# Patient Record
Sex: Female | Born: 1965 | Race: White | Hispanic: No | Marital: Married | Smoking: Former smoker
Health system: Southern US, Community
[De-identification: ages and names within clinical notes are randomized; demographics above are authoritative.]

## PROBLEM LIST (undated history)

## (undated) DIAGNOSIS — M6780 Other specified disorders of synovium and tendon, unspecified site: Secondary | ICD-10-CM

## (undated) DIAGNOSIS — E119 Type 2 diabetes mellitus without complications: Secondary | ICD-10-CM

## (undated) DIAGNOSIS — G5600 Carpal tunnel syndrome, unspecified upper limb: Secondary | ICD-10-CM

## (undated) DIAGNOSIS — F419 Anxiety disorder, unspecified: Secondary | ICD-10-CM

## (undated) DIAGNOSIS — R519 Headache, unspecified: Secondary | ICD-10-CM

## (undated) DIAGNOSIS — D492 Neoplasm of unspecified behavior of bone, soft tissue, and skin: Secondary | ICD-10-CM

## (undated) DIAGNOSIS — M4712 Other spondylosis with myelopathy, cervical region: Secondary | ICD-10-CM

## (undated) DIAGNOSIS — E785 Hyperlipidemia, unspecified: Secondary | ICD-10-CM

## (undated) DIAGNOSIS — K219 Gastro-esophageal reflux disease without esophagitis: Secondary | ICD-10-CM

## (undated) DIAGNOSIS — I739 Peripheral vascular disease, unspecified: Secondary | ICD-10-CM

## (undated) DIAGNOSIS — H269 Unspecified cataract: Secondary | ICD-10-CM

## (undated) DIAGNOSIS — A6000 Herpesviral infection of urogenital system, unspecified: Secondary | ICD-10-CM

## (undated) DIAGNOSIS — N611 Abscess of the breast and nipple: Secondary | ICD-10-CM

## (undated) DIAGNOSIS — J189 Pneumonia, unspecified organism: Secondary | ICD-10-CM

## (undated) DIAGNOSIS — J45909 Unspecified asthma, uncomplicated: Secondary | ICD-10-CM

## (undated) DIAGNOSIS — B356 Tinea cruris: Secondary | ICD-10-CM

## (undated) DIAGNOSIS — M1990 Unspecified osteoarthritis, unspecified site: Secondary | ICD-10-CM

## (undated) DIAGNOSIS — Z973 Presence of spectacles and contact lenses: Secondary | ICD-10-CM

## (undated) DIAGNOSIS — I10 Essential (primary) hypertension: Secondary | ICD-10-CM

## (undated) DIAGNOSIS — R200 Anesthesia of skin: Secondary | ICD-10-CM

## (undated) DIAGNOSIS — K589 Irritable bowel syndrome without diarrhea: Secondary | ICD-10-CM

## (undated) HISTORY — DX: Carpal tunnel syndrome, unspecified upper limb: G56.00

## (undated) HISTORY — DX: Hyperlipidemia, unspecified: E78.5

## (undated) HISTORY — DX: Other specified disorders of synovium and tendon, unspecified site: M67.80

## (undated) HISTORY — DX: Unspecified cataract: H26.9

## (undated) HISTORY — DX: Type 2 diabetes mellitus without complications: E11.9

## (undated) HISTORY — DX: Anesthesia of skin: R20.0

## (undated) HISTORY — DX: Unspecified osteoarthritis, unspecified site: M19.90

## (undated) HISTORY — DX: Tinea cruris: B35.6

## (undated) HISTORY — PX: NOSE SURGERY: SHX723

## (undated) HISTORY — PX: EYE SURGERY: SHX253

## (undated) HISTORY — DX: Unspecified asthma, uncomplicated: J45.909

## (undated) HISTORY — PX: MOUTH SURGERY: SHX715

## (undated) HISTORY — DX: Neoplasm of unspecified behavior of bone, soft tissue, and skin: D49.2

## (undated) HISTORY — DX: Abscess of the breast and nipple: N61.1

## (undated) HISTORY — PX: BREAST LUMPECTOMY: SHX2

## (undated) HISTORY — DX: Gastro-esophageal reflux disease without esophagitis: K21.9

## (undated) HISTORY — DX: Herpesviral infection of urogenital system, unspecified: A60.00

## (undated) HISTORY — DX: Irritable bowel syndrome, unspecified: K58.9

## (undated) HISTORY — PX: KNEE SURGERY: SHX244

## (undated) HISTORY — DX: Anxiety disorder, unspecified: F41.9

---

## 2004-04-06 HISTORY — PX: TONSILLECTOMY: SUR1361

## 2006-05-21 ENCOUNTER — Ambulatory Visit: Payer: Self-pay | Admitting: Internal Medicine

## 2006-06-07 ENCOUNTER — Encounter: Admission: RE | Admit: 2006-06-07 | Discharge: 2006-06-07 | Payer: Self-pay | Admitting: Unknown Physician Specialty

## 2006-07-19 ENCOUNTER — Ambulatory Visit (HOSPITAL_COMMUNITY): Admission: RE | Admit: 2006-07-19 | Discharge: 2006-07-19 | Payer: Self-pay | Admitting: Internal Medicine

## 2006-07-19 ENCOUNTER — Encounter (INDEPENDENT_AMBULATORY_CARE_PROVIDER_SITE_OTHER): Payer: Self-pay | Admitting: Specialist

## 2006-07-19 ENCOUNTER — Ambulatory Visit: Payer: Self-pay | Admitting: Internal Medicine

## 2008-06-20 ENCOUNTER — Ambulatory Visit (HOSPITAL_COMMUNITY): Admission: RE | Admit: 2008-06-20 | Discharge: 2008-06-20 | Payer: Self-pay | Admitting: Ophthalmology

## 2010-04-27 ENCOUNTER — Encounter: Payer: Self-pay | Admitting: Unknown Physician Specialty

## 2010-07-17 LAB — BASIC METABOLIC PANEL
CO2: 17 mEq/L — ABNORMAL LOW (ref 19–32)
Creatinine, Ser: 0.72 mg/dL (ref 0.4–1.2)
GFR calc Af Amer: >60 mL/min (ref >60)
GFR calc non Af Amer: >60 mL/min (ref >60)
Glucose, Bld: 184 mg/dL — ABNORMAL HIGH (ref 70–99)

## 2010-07-17 LAB — GLUCOSE, CAPILLARY: Glucose-Capillary: 170 mg/dL — ABNORMAL HIGH (ref 70–99)
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
## 2010-08-22 NOTE — Op Note (Signed)
NAME:  ASHARIA, LOTTER                ACCOUNT NO.:  [id number]   MEDICAL RECORD NO.:  [id number]          PATIENT TYPE:  AMB   LOCATION:  DAY                           FACILITY:  [facility]   PHYSICIAN:  Lionel December, M.D.    DATE OF BIRTH:  02/04/1966   DATE OF PROCEDURE:  07/19/2006  DATE OF DISCHARGE:                               OPERATIVE REPORT   PROCEDURE:  Esophagogastroduodenoscopy followed by colonoscopy with  terminal ileoscopy.   INDICATIONS:  Haley Dunn is a 45 year old Caucasian female with a several-year  history of GERD, who is undergoing EGD to make sure she does not have  Barrett's.  She has a history of bloody diarrhea that started over 2  months ago.  Stool studies have been negative.  She is been treated with  Cipro and Flagyl, without symptomatic improvement.  She is therefore  undergoing diagnostic colonoscopy.  One of her aunts has Crohn's  disease.   Procedure and risks were reviewed with the patient, and informed consent  was obtained.   MEDICATIONS FOR CONSCIOUS SEDATION:  1. Benzocaine spray for pharyngeal topical anesthesia.  2. Demerol 50 mg IV.  3. Versed 12 mg IV.   FINDINGS:  Procedure performed in endoscopy suite.  The patient's vital  signs and O2 saturation were monitored during the procedure and remained  stable.   1. Esophagogastroduodenoscopy.  The patient was placed into the left      lateral position, and Pentax videoscope was passed via oropharynx      without any difficulty into esophagus.   Esophagus:  Mucosa of the esophagus normal.  GE junction was located at  36 8 cm from the incisors, and hiatus was at 40 cm.   Stomach:  It was empty and distended very well insufflation.  Folds of  the proximal stomach were normal.  Examination of the mucosa revealed  patchy erythema and few erosions at antrum.  Pyloric channel was patent.  Angularis, fundus, and cardia were examined by retroflexing the scope  and were normal.   Duodenum:  Bulbar mucosa  revealed patchy erythema, but no erosions or  ulcers were noted.  The scope was passed to the second part of the  duodenum, where mucosa and folds were normal.  Endoscope was withdrawn.  The patient was prepared for procedure #2.   1. Colonoscopy.  Rectal examination was performed.  No abnormality      noted on external or digital exam.  Pentax videoscope was placed      into the rectum and advanced under vision into sigmoid colon and      beyond.  Preparation was satisfactory.  Scope was passed to the      cecum, which was identified by appendiceal orifice and ileocecal      valve.  Short segment of TI was also examined and was normal.      Colonic mucosa was carefully examined on the way out and revealed      normal mucosal pattern throughout.  Random biopsies were taken from      the sigmoid colon.  Rectal mucosa similarly was  normal.  The scope      was retroflexed to examine the anorectal junction.  There was 4 4      mm polyp proximal to the dentate line which was ablated via cold      biopsy.  Small hemorrhoids were noted below the dentate line.  The      endoscope was straightened and withdrawn.  The patient tolerated      the procedures well.   FINAL DIAGNOSES:  1. A small sliding hiatal hernia.  No evidence of Barrett's esophagus.  2. Erosive antral gastritis, with bulbar duodenitis.  This is possibly      related to nonsteroidal antiinflammatory drug use, but we will      check Helicobacter pylori to rule out Helicobacter pylori      infection.  3. Normal colonoscopy and terminal ileoscopy, except a 4 mm polyp in      the rectum which was ablated via cold biopsy.  4. Small external hemorrhoids.  5. No evidence of Crohn's disease or ulcerative colitis.   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   RECOMMENDATIONS:  1. She will continue antireflux measures and Nexium as before.  2. Levbid 1 tablet  every morning.  3. Benefiber 4 g daily.   We will check her CBC with differential and Helicobacter pylori serology  today.      Lionel December, M.D.  Electronically Signed     NR/MEDQ  D:  07/19/2006  T:  07/19/2006  Job:  [id number]   cc:   Doreen Beam  Fax: [phone]

## 2014-08-01 ENCOUNTER — Encounter: Payer: Self-pay | Admitting: Vascular Surgery

## 2014-08-01 ENCOUNTER — Other Ambulatory Visit: Payer: Self-pay | Admitting: *Deleted

## 2014-08-01 DIAGNOSIS — M79604 Pain in right leg: Secondary | ICD-10-CM

## 2014-08-29 ENCOUNTER — Encounter: Payer: Self-pay | Admitting: Vascular Surgery

## 2014-08-30 ENCOUNTER — Ambulatory Visit (INDEPENDENT_AMBULATORY_CARE_PROVIDER_SITE_OTHER): Payer: BLUE CROSS/BLUE SHIELD | Admitting: Vascular Surgery

## 2014-08-30 ENCOUNTER — Encounter: Payer: Self-pay | Admitting: Vascular Surgery

## 2014-08-30 ENCOUNTER — Ambulatory Visit (HOSPITAL_COMMUNITY)
Admission: RE | Admit: 2014-08-30 | Discharge: 2014-08-30 | Disposition: A | Discharge status: Home / Self Care | Payer: BLUE CROSS/BLUE SHIELD | Source: Ambulatory Visit | Attending: Vascular Surgery | Admitting: Vascular Surgery

## 2014-08-30 ENCOUNTER — Other Ambulatory Visit: Payer: Self-pay

## 2014-08-30 VITALS — BP 161/76 | HR 91 | Ht 64.0 in | Wt 199.3 lb

## 2014-08-30 DIAGNOSIS — F17210 Nicotine dependence, cigarettes, uncomplicated: Secondary | ICD-10-CM

## 2014-08-30 DIAGNOSIS — I739 Peripheral vascular disease, unspecified: Secondary | ICD-10-CM

## 2014-08-30 DIAGNOSIS — M79604 Pain in right leg: Secondary | ICD-10-CM | POA: Insufficient documentation

## 2014-08-30 NOTE — Progress Notes (Signed)
[facility] [place] HISTORY AND PHYSICAL   History of Present Illness:  Patient is a 49 y.o. year old female who presents for evaluation of right leg pain.  The patient has had pain in her right leg since December. She fell at that time. She had chronic right knee pain prior to that. However after her fall she began having hip pain on the right side. This has become progressively worse over the last several months. She has now progressed to the point where she has numbness in her right foot. She also has pain in her right foot which worsens at night time. She is unable to walk due to pain on short distance. She currently smokes a half pack of cigarettes per day. She smoked 1 pack per day prior to this. She is trying to quit. Greater than 3 minutes they were spent regarding smoking cessation counseling.  Other medical problems include diabetes, anxiety, asthma all of which are currently controlled.  Past Medical History  Diagnosis Date  . Acid reflux   . Anxiety   . Asthma   . Cataracts, bilateral   . Diabetes mellitus without complication     Past Surgical History  Procedure Laterality Date  . Nose surgery    . Mouth surgery    . Knee surgery    . Breast lumpectomy    . Tonsillectomy  2006    Social History History  Substance Use Topics  . Smoking status: Current Every Day Smoker -- 0.50 packs/day for 30 years    Types: Cigarettes  . Smokeless tobacco: Not on file  . Alcohol Use: 2.4 - 3.6 oz/week    4-6 Cans of beer per week    Family History Family History  Problem Relation Age of Onset  . Cancer Mother   . Deep vein thrombosis Father    she has multiple relatives that a been taken care of by our practice with advanced atherosclerosis  Allergies  Allergies  Allergen Reactions  . Codeine Itching  . Sulfur Rash     Current Outpatient Prescriptions  Medication Sig Dispense Refill  . ALPRAZolam (XANAX) 1 MG tablet Take 0.5 tablets by mouth 2  (two) times daily as needed.  3  . diclofenac (VOLTAREN) 75 MG EC tablet Take 75 mg by mouth 2 (two) times daily.  5  . furosemide (LASIX) 40 MG tablet Take 1 tablet by mouth as needed.  1  . glipiZIDE (GLUCOTROL) 10 MG tablet Take 10 mg by mouth 2 (two) times daily.  1  . metFORMIN (GLUCOPHAGE) 500 MG tablet Take 1 tablet by mouth 2 (two) times daily.  5  . omeprazole (PRILOSEC) 20 MG capsule Take 20 mg by mouth daily.  12  . OSENI 25-15 MG TABS Take 1 tablet by mouth daily.  0   No current facility-administered medications for this visit.    ROS:   General:  No weight loss, Fever, chills  HEENT: No recent headaches, no nasal bleeding, no visual changes, no sore throat  Neurologic: No dizziness, blackouts, seizures. No recent symptoms of stroke or mini- stroke. No recent episodes of slurred speech, or temporary blindness.  Cardiac: No recent episodes of chest pain/pressure, no shortness of breath at rest.  No shortness of breath with exertion.  Denies history of atrial fibrillation or irregular heartbeat  Vascular: + history of rest pain in feet. +history of claudication.  No history of non-healing ulcer, No history of DVT   Pulmonary: No home  oxygen, no productive cough, no hemoptysis,  + asthma or wheezing  Musculoskeletal:  [x ] Arthritis, [x ] Low back pain,  [x ] Joint pain  Hematologic:No history of hypercoagulable state.  No history of easy bleeding.  No history of anemia  Gastrointestinal: No hematochezia or melena,  + gastroesophageal reflux, no trouble swallowing  Urinary: [ ]  chronic Kidney disease, [ ]  on HD - [ ]  MWF or [ ]  TTHS, [ ]  Burning with urination, [ ]  Frequent urination, [ ]  Difficulty urinating;   Skin: No rashes  Psychological: + history of anxiety,  No history of depression   Physical Examination  Filed Vitals:   08/30/14 1429  BP: 161/76  Pulse: 91  Height: 5\' 4"  (1.626 m)  Weight: 199 lb 4.8 oz (90.402 kg)  SpO2: 98%    Body mass index is  34.19 kg/(m^2).  General:  Alert and oriented, no acute distress HEENT: Normal Neck: No bruit or JVD Pulmonary: Clear to auscultation bilaterally Cardiac: Regular Rate and Rhythm without murmur Abdomen: Soft, non-tender, non-distended, no mass, obese Skin: No rash, no ulcer Extremity Pulses:  2+ radial, brachial, negative femoral bilaterally, 2+ left dorsalis pedis, absent popliteal and right dorsalis pedis pulse absent posterior tibial pulses bilaterally Musculoskeletal: No deformity or edema  Neurologic: Upper and lower extremity motor 5/5 and symmetric  DATA: Patient had bilateral ABIs performed on 07/24/2014. 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 She had biphasic flow on the left side also suggesting iliac occlusive disease.  ASSESSMENT:  Right lower extremity claudication now with rest pain.   PLAN:  #1 patient was started on one aspirin daily today.               #2 consideration needs to begin starting the patient on a statin since presence of peripheral arterial disease patients have had improved mortality on a statin.  I will leave this at the discretion of her primary care physician.                #3 patient will be scheduled for aortogram bilateral lower extremity runoff possible intervention on 09/07/2014. Risks benefits possible complications and procedure details were explained to the patient today including but not limited to bleeding infection vessel injury. I also discussed with the patient today that the occlusive disease is extensive she may require an operation rather than a peripheral intervention and this will be scheduled for a later date after further evaluation by cardiology.  Ruta Hinds, MD [facility] [place] Office: [phone] Pager: [phone]

## 2014-09-04 ENCOUNTER — Encounter (HOSPITAL_COMMUNITY): Payer: Self-pay

## 2014-09-04 ENCOUNTER — Encounter: Payer: Self-pay | Admitting: Vascular Surgery

## 2014-09-07 ENCOUNTER — Encounter (HOSPITAL_COMMUNITY): Payer: Self-pay | Admitting: Vascular Surgery

## 2014-09-07 ENCOUNTER — Ambulatory Visit (HOSPITAL_COMMUNITY)
Admission: RE | Admit: 2014-09-07 | Discharge: 2014-09-07 | Disposition: A | Discharge status: Home / Self Care | Payer: BLUE CROSS/BLUE SHIELD | Source: Ambulatory Visit | Attending: Vascular Surgery | Admitting: Vascular Surgery

## 2014-09-07 ENCOUNTER — Telehealth: Payer: Self-pay | Admitting: Vascular Surgery

## 2014-09-07 ENCOUNTER — Encounter (HOSPITAL_COMMUNITY)
Admission: RE | Disposition: A | Discharge status: Home / Self Care | Payer: BLUE CROSS/BLUE SHIELD | Source: Ambulatory Visit | Attending: Vascular Surgery

## 2014-09-07 ENCOUNTER — Other Ambulatory Visit: Payer: Self-pay

## 2014-09-07 DIAGNOSIS — I7092 Chronic total occlusion of artery of the extremities: Secondary | ICD-10-CM | POA: Diagnosis not present

## 2014-09-07 DIAGNOSIS — E119 Type 2 diabetes mellitus without complications: Secondary | ICD-10-CM | POA: Diagnosis not present

## 2014-09-07 DIAGNOSIS — I70211 Atherosclerosis of native arteries of extremities with intermittent claudication, right leg: Secondary | ICD-10-CM | POA: Diagnosis not present

## 2014-09-07 DIAGNOSIS — F419 Anxiety disorder, unspecified: Secondary | ICD-10-CM | POA: Insufficient documentation

## 2014-09-07 DIAGNOSIS — J45909 Unspecified asthma, uncomplicated: Secondary | ICD-10-CM | POA: Diagnosis not present

## 2014-09-07 DIAGNOSIS — F17210 Nicotine dependence, cigarettes, uncomplicated: Secondary | ICD-10-CM | POA: Insufficient documentation

## 2014-09-07 DIAGNOSIS — I70203 Unspecified atherosclerosis of native arteries of extremities, bilateral legs: Secondary | ICD-10-CM | POA: Diagnosis not present

## 2014-09-07 DIAGNOSIS — G8929 Other chronic pain: Secondary | ICD-10-CM | POA: Insufficient documentation

## 2014-09-07 DIAGNOSIS — I70201 Unspecified atherosclerosis of native arteries of extremities, right leg: Secondary | ICD-10-CM | POA: Diagnosis present

## 2014-09-07 HISTORY — PX: PERIPHERAL VASCULAR CATHETERIZATION: SHX172C

## 2014-09-07 LAB — POCT I-STAT, CHEM 8
BUN: 20 mg/dL (ref 6–20)
CALCIUM ION: 1.19 mmol/L (ref 1.12–1.23)
Chloride: 105 mmol/L (ref 101–111)
Creatinine, Ser: 0.7 mg/dL (ref 0.44–1.00)
Glucose, Bld: 164 mg/dL — ABNORMAL HIGH (ref 65–99)
HCT: 44 % (ref 36.0–46.0)
Hemoglobin: 15 g/dL (ref 12.0–15.0)
POTASSIUM: 3.7 mmol/L (ref 3.5–5.1)
Sodium: 138 mmol/L (ref 135–145)
TCO2: 22 mmol/L (ref 0–100)

## 2014-09-07 LAB — GLUCOSE, CAPILLARY: Glucose-Capillary: 146 mg/dL — ABNORMAL HIGH (ref 65–99)

## 2014-09-07 LAB — PREGNANCY, URINE: Preg Test, Ur: NEGATIVE

## 2014-09-07 SURGERY — ABDOMINAL AORTOGRAM
Anesthesia: LOCAL

## 2014-09-07 MED ORDER — MORPHINE SULFATE 10 MG/ML IJ SOLN: 2.0000 mg | INTRAMUSCULAR | Status: DC | PRN

## 2014-09-07 MED ORDER — POTASSIUM CHLORIDE CRYS ER 20 MEQ PO TBCR: 20.0000 meq | EXTENDED_RELEASE_TABLET | Freq: Every day | ORAL | Status: DC | PRN

## 2014-09-07 MED ORDER — SODIUM CHLORIDE 0.9 % IV SOLN
INTRAVENOUS | Status: DC
  Administered 2014-09-07: 07:00:00 via INTRAVENOUS

## 2014-09-07 MED ORDER — PHENOL 1.4 % MT LIQD: 1.0000 | OROMUCOSAL | Status: DC | PRN

## 2014-09-07 MED ORDER — FENTANYL CITRATE (PF) 100 MCG/2ML IJ SOLN
INTRAMUSCULAR | Status: AC
  Filled 2014-09-07: qty 2

## 2014-09-07 MED ORDER — ACETAMINOPHEN 325 MG PO TABS: 325.0000 mg | ORAL_TABLET | ORAL | Status: DC | PRN

## 2014-09-07 MED ORDER — SODIUM CHLORIDE 0.45 % IV SOLN
INTRAVENOUS | Status: DC
Start: 2014-09-07 — End: 2014-09-07

## 2014-09-07 MED ORDER — LIDOCAINE HCL (PF) 1 % IJ SOLN
INTRAMUSCULAR | Status: DC | PRN
  Administered 2014-09-07: 30 mL

## 2014-09-07 MED ORDER — SODIUM CHLORIDE 0.9 % IV SOLN: 500.0000 mL | Freq: Once | INTRAVENOUS | Status: DC | PRN

## 2014-09-07 MED ORDER — LABETALOL HCL 5 MG/ML IV SOLN: 10.0000 mg | INTRAVENOUS | Status: DC | PRN

## 2014-09-07 MED ORDER — ACETAMINOPHEN 325 MG RE SUPP: 325.0000 mg | RECTAL | Status: DC | PRN

## 2014-09-07 MED ORDER — IODIXANOL 320 MG/ML IV SOLN
INTRAVENOUS | Status: DC | PRN
  Administered 2014-09-07: 130 mL via INTRAVENOUS

## 2014-09-07 MED ORDER — MIDAZOLAM HCL 2 MG/2ML IJ SOLN
INTRAMUSCULAR | Status: AC
  Filled 2014-09-07: qty 2

## 2014-09-07 MED ORDER — METOPROLOL TARTRATE 1 MG/ML IV SOLN: 2.0000 mg | INTRAVENOUS | Status: DC | PRN

## 2014-09-07 MED ORDER — LIDOCAINE HCL (PF) 1 % IJ SOLN
INTRAMUSCULAR | Status: AC
  Filled 2014-09-07: qty 30

## 2014-09-07 MED ORDER — GUAIFENESIN-DM 100-10 MG/5ML PO SYRP: 15.0000 mL | ORAL_SOLUTION | ORAL | Status: DC | PRN

## 2014-09-07 MED ORDER — ONDANSETRON HCL 4 MG/2ML IJ SOLN: 4.0000 mg | Freq: Four times a day (QID) | INTRAMUSCULAR | Status: DC | PRN

## 2014-09-07 MED ORDER — MAGNESIUM SULFATE 2 GM/50ML IV SOLN: 2.0000 g | Freq: Every day | INTRAVENOUS | Status: DC | PRN

## 2014-09-07 MED ORDER — HYDRALAZINE HCL 20 MG/ML IJ SOLN: 5.0000 mg | INTRAMUSCULAR | Status: DC | PRN

## 2014-09-07 MED ORDER — DOCUSATE SODIUM 100 MG PO CAPS: 100.0000 mg | ORAL_CAPSULE | Freq: Every day | ORAL | Status: DC

## 2014-09-07 MED ORDER — HEPARIN (PORCINE) IN NACL 2-0.9 UNIT/ML-% IJ SOLN
INTRAMUSCULAR | Status: AC
  Filled 2014-09-07: qty 1000

## 2014-09-07 MED ORDER — MIDAZOLAM HCL 2 MG/2ML IJ SOLN
INTRAMUSCULAR | Status: DC | PRN
  Administered 2014-09-07: 1 mg via INTRAVENOUS

## 2014-09-07 MED ORDER — DEXTROSE 5 % IV SOLN: 1.5000 g | Freq: Two times a day (BID) | INTRAVENOUS | Status: DC

## 2014-09-07 SURGICAL SUPPLY — 13 items
CATH ANGIO 5F PIGTAIL 65CM (CATHETERS) ×1 IMPLANT
COVER PRB 48X5XTLSCP FOLD TPE (BAG) IMPLANT
COVER PROBE 5X48 (BAG) ×3
HAND CONTROLLER AVANTA (MISCELLANEOUS) IMPLANT
KIT PV (KITS) ×3 IMPLANT
SET AVANTA MULTI PATIENT (MISCELLANEOUS) IMPLANT
SET AVANTA SINGLE PATIENT (MISCELLANEOUS) IMPLANT
SHEATH AVANTA HAND CONTROLLER (MISCELLANEOUS) IMPLANT
SHEATH PINNACLE 5F 10CM (SHEATH) ×1 IMPLANT
SYR MEDRAD MARK V 150ML (SYRINGE) ×1 IMPLANT
TRANSDUCER W/STOPCOCK (MISCELLANEOUS) ×3 IMPLANT
TRAY PV CATH (CUSTOM PROCEDURE TRAY) ×3 IMPLANT
WIRE HITORQ VERSACORE ST 145CM (WIRE) ×1 IMPLANT

## 2014-09-07 NOTE — Progress Notes (Signed)
Site area: Left groin a 5 french arterial sheath was removed  Site Prior to Removal:  Level 0  Pressure Applied For 20 MINUTES    Minutes Beginning at 1005a  Manual:   Yes.    Patient Status During Pull:  stable  Post Pull Groin Site:  Level 0  Post Pull Instructions Given:  Yes.    Post Pull Pulses Present:  Yes.    Dressing Applied:  Yes.    Comments:  VS remain stable during sheath pull. Pt denies any discomfort at site at this time.

## 2014-09-07 NOTE — H&P (View-Only) (Signed)
[facility] [place] HISTORY AND PHYSICAL   History of Present Illness:  Patient is a 49 y.o. year old female who presents for evaluation of right leg pain.  The patient has had pain in her right leg since December. She fell at that time. She had chronic right knee pain prior to that. However after her fall she began having hip pain on the right side. This has become progressively worse over the last several months. She has now progressed to the point where she has numbness in her right foot. She also has pain in her right foot which worsens at night time. She is unable to walk due to pain on short distance. She currently smokes a half pack of cigarettes per day. She smoked 1 pack per day prior to this. She is trying to quit. Greater than 3 minutes they were spent regarding smoking cessation counseling.  Other medical problems include diabetes, anxiety, asthma all of which are currently controlled.  Past Medical History  Diagnosis Date  . Acid reflux   . Anxiety   . Asthma   . Cataracts, bilateral   . Diabetes mellitus without complication     Past Surgical History  Procedure Laterality Date  . Nose surgery    . Mouth surgery    . Knee surgery    . Breast lumpectomy    . Tonsillectomy  2006    Social History History  Substance Use Topics  . Smoking status: Current Every Day Smoker -- 0.50 packs/day for 30 years    Types: Cigarettes  . Smokeless tobacco: Not on file  . Alcohol Use: 2.4 - 3.6 oz/week    4-6 Cans of beer per week    Family History Family History  Problem Relation Age of Onset  . Cancer Mother   . Deep vein thrombosis Father    she has multiple relatives that a been taken care of by our practice with advanced atherosclerosis  Allergies  Allergies  Allergen Reactions  . Codeine Itching  . Sulfur Rash     Current Outpatient Prescriptions  Medication Sig Dispense Refill  . ALPRAZolam (XANAX) 1 MG tablet Take 0.5 tablets by mouth 2  (two) times daily as needed.  3  . diclofenac (VOLTAREN) 75 MG EC tablet Take 75 mg by mouth 2 (two) times daily.  5  . furosemide (LASIX) 40 MG tablet Take 1 tablet by mouth as needed.  1  . glipiZIDE (GLUCOTROL) 10 MG tablet Take 10 mg by mouth 2 (two) times daily.  1  . metFORMIN (GLUCOPHAGE) 500 MG tablet Take 1 tablet by mouth 2 (two) times daily.  5  . omeprazole (PRILOSEC) 20 MG capsule Take 20 mg by mouth daily.  12  . OSENI 25-15 MG TABS Take 1 tablet by mouth daily.  0   No current facility-administered medications for this visit.    ROS:   General:  No weight loss, Fever, chills  HEENT: No recent headaches, no nasal bleeding, no visual changes, no sore throat  Neurologic: No dizziness, blackouts, seizures. No recent symptoms of stroke or mini- stroke. No recent episodes of slurred speech, or temporary blindness.  Cardiac: No recent episodes of chest pain/pressure, no shortness of breath at rest.  No shortness of breath with exertion.  Denies history of atrial fibrillation or irregular heartbeat  Vascular: + history of rest pain in feet. +history of claudication.  No history of non-healing ulcer, No history of DVT   Pulmonary: No home  oxygen, no productive cough, no hemoptysis,  + asthma or wheezing  Musculoskeletal:  [x ] Arthritis, [x ] Low back pain,  [x ] Joint pain  Hematologic:No history of hypercoagulable state.  No history of easy bleeding.  No history of anemia  Gastrointestinal: No hematochezia or melena,  + gastroesophageal reflux, no trouble swallowing  Urinary: [ ]  chronic Kidney disease, [ ]  on HD - [ ]  MWF or [ ]  TTHS, [ ]  Burning with urination, [ ]  Frequent urination, [ ]  Difficulty urinating;   Skin: No rashes  Psychological: + history of anxiety,  No history of depression   Physical Examination  Filed Vitals:   08/30/14 1429  BP: 161/76  Pulse: 91  Height: 5\' 4"  (1.626 m)  Weight: 199 lb 4.8 oz (90.402 kg)  SpO2: 98%    Body mass index is  34.19 kg/(m^2).  General:  Alert and oriented, no acute distress HEENT: Normal Neck: No bruit or JVD Pulmonary: Clear to auscultation bilaterally Cardiac: Regular Rate and Rhythm without murmur Abdomen: Soft, non-tender, non-distended, no mass, obese Skin: No rash, no ulcer Extremity Pulses:  2+ radial, brachial, negative femoral bilaterally, 2+ left dorsalis pedis, absent popliteal and right dorsalis pedis pulse absent posterior tibial pulses bilaterally Musculoskeletal: No deformity or edema  Neurologic: Upper and lower extremity motor 5/5 and symmetric  DATA: Patient had bilateral ABIs performed on 07/24/2014. 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 She had biphasic flow on the left side also suggesting iliac occlusive disease.  ASSESSMENT:  Right lower extremity claudication now with rest pain.   PLAN:  #1 patient was started on one aspirin daily today.               #2 consideration needs to begin starting the patient on a statin since presence of peripheral arterial disease patients have had improved mortality on a statin.  I will leave this at the discretion of her primary care physician.                #3 patient will be scheduled for aortogram bilateral lower extremity runoff possible intervention on 09/07/2014. Risks benefits possible complications and procedure details were explained to the patient today including but not limited to bleeding infection vessel injury. I also discussed with the patient today that the occlusive disease is extensive she may require an operation rather than a peripheral intervention and this will be scheduled for a later date after further evaluation by cardiology.  Ruta Hinds, MD [facility] [place] Office: [phone] Pager: [phone]

## 2014-09-07 NOTE — Discharge Instructions (Signed)
° ° °

## 2014-09-07 NOTE — Interval H&P Note (Signed)
History and Physical Interval Note:  09/07/2014 8:48 AM  Haley Dunn  has presented today for surgery, with the diagnosis of pvd with RLE rest pain  The various methods of treatment have been discussed with the patient and family. After consideration of risks, benefits and other options for treatment, the patient has consented to  Procedure(s): Abdominal Aortogram (N/A)  With lower extremity runoff possible intervention as a surgical intervention .  The patient's history has been reviewed, patient examined, no change in status, stable for surgery.  I have reviewed the patient's chart and labs.  Questions were answered to the patient's satisfaction.     Ruta Hinds

## 2014-09-07 NOTE — Telephone Encounter (Signed)
notified patient of appointment with dr. Bronson Ing for cardiac clearance on 09-10-14 at 1:20 in [place].

## 2014-09-10 ENCOUNTER — Encounter: Payer: Self-pay | Admitting: Cardiovascular Disease

## 2014-09-10 ENCOUNTER — Ambulatory Visit (INDEPENDENT_AMBULATORY_CARE_PROVIDER_SITE_OTHER): Payer: BLUE CROSS/BLUE SHIELD | Admitting: Cardiovascular Disease

## 2014-09-10 ENCOUNTER — Telehealth: Payer: Self-pay | Admitting: Cardiovascular Disease

## 2014-09-10 ENCOUNTER — Encounter: Payer: Self-pay | Admitting: *Deleted

## 2014-09-10 VITALS — BP 110/78 | HR 98 | Ht 64.0 in | Wt 195.0 lb

## 2014-09-10 DIAGNOSIS — Z716 Tobacco abuse counseling: Secondary | ICD-10-CM | POA: Diagnosis not present

## 2014-09-10 DIAGNOSIS — Z01818 Encounter for other preprocedural examination: Secondary | ICD-10-CM | POA: Diagnosis not present

## 2014-09-10 DIAGNOSIS — I739 Peripheral vascular disease, unspecified: Secondary | ICD-10-CM

## 2014-09-10 NOTE — Patient Instructions (Signed)
Your physician has requested that you have a lexiscan myoview. For further information please visit [email or web address]. Please follow instruction sheet, as given. Lab for Lipids - Reminder:  Nothing to eat or drink after 12 midnight prior to labs.  Office will contact with results via phone or letter.   Please call office back with name of statin on previously. Continue all current medications. Follow up to be determined.

## 2014-09-10 NOTE — Telephone Encounter (Signed)
Lexiscan - preop Scheduled at [facility] on June 8th arrival at 6:45.

## 2014-09-10 NOTE — Progress Notes (Signed)
Patient ID: Haley Dunn, female   DOB: 23-Mar-1966, 49 y.o.   MRN: [id number]       CARDIOLOGY CONSULT NOTE  Patient ID: Haley Dunn MRN: [id number] DOB/AGE: Aug 11, 1965 49 y.o.  Admit date: (Not on file) Primary Physician [name]., MD  Reason for Consultation: preop clearance  HPI: The patient is a 49 year old woman with a history of diabetes mellitus and tobacco abuse. She had been experiencing right knee pain and foot numbness and after an extensive orthopedic evaluation, underwent abdominal aortography with bilateral lower extremity runoff on 6/3. 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 She is referred today for preoperative risk stratification.  ECG on 6/3 demonstrated normal sinus rhythm with no gross ischemic ST segment or T-wave abnormalities.  She denies exertional chest pain, shortness of breath, and palpitations. She also denies orthopnea and paroxysmal nocturnal dyspnea. She was prescribed a statin by her PCP but she does not remember the name of this. She denies having had her lipids checked in the past 12 months. She used to smoke up to one pack of cigarettes daily and has cut back to half a pack daily but wants to quit. Her symptoms began this past March with right hip, knee, and leg pain followed by right foot numbness at Easter.  She denies ever having undergone a cardiac evaluation.    Allergies  Allergen Reactions  . Codeine Itching  . Sulfur Rash    Current Outpatient Prescriptions  Medication Sig Dispense Refill  . ALPRAZolam (XANAX) 1 MG tablet Take 0.5 tablets by mouth 2 (two) times daily as needed for anxiety or sleep.   3  . aspirin 325 MG tablet Take 325 mg by mouth daily.    . diclofenac (VOLTAREN) 75 MG EC tablet Take 75 mg by mouth 2 (two) times daily as needed (pain).   5  . furosemide (LASIX) 40 MG tablet Take 1 tablet by mouth as  needed for fluid or edema.   1  . glipiZIDE (GLUCOTROL) 10 MG tablet Take 10 mg by mouth 2 (two) times daily.  1  . medroxyPROGESTERone (DEPO-PROVERA) 150 MG/ML injection Inject 150 mg into the muscle every 3 (three) months.   4  . metFORMIN (GLUCOPHAGE) 500 MG tablet Take 1 tablet by mouth 2 (two) times daily.  5  . omeprazole (PRILOSEC) 20 MG capsule Take 20 mg by mouth daily as needed (acid reflux).   12  . OSENI 25-15 MG TABS Take 1 tablet by mouth daily.  0  . PROAIR HFA 108 (90 BASE) MCG/ACT inhaler Inhale 2 puffs into the lungs every 6 (six) hours as needed for wheezing or shortness of breath.   3   No current facility-administered medications for this visit.    Past Medical History  Diagnosis Date  . Acid reflux   . Anxiety   . Asthma   . Cataracts, bilateral   . Diabetes mellitus without complication     Past Surgical History  Procedure Laterality Date  . Nose surgery    . Mouth surgery    . Knee surgery    . Breast lumpectomy    . Tonsillectomy  2006  . Peripheral vascular catheterization N/A 09/07/2014    Procedure: Abdominal Aortogram;  Surgeon: Elam Dutch, MD;  Location: [place] [facility];  Service: Cardiovascular;  Laterality: N/A;  . Peripheral vascular catheterization Bilateral 09/07/2014    Procedure: Lower Extremity  Angiography;  Surgeon: Elam Dutch, MD;  Location: [place] [facility];  Service: Cardiovascular;  Laterality: Bilateral;    History   Social History  . Marital Status: Married    Spouse Name: N/A  . Number of Children: N/A  . Years of Education: N/A   Occupational History  . Not on file.   Social History Main Topics  . Smoking status: Current Every Day Smoker -- 0.50 packs/day for 30 years    Types: Cigarettes  . Smokeless tobacco: Never Used  . Alcohol Use: 2.4 - 3.6 oz/week    4-6 Cans of beer per week  . Drug Use: No  . Sexual Activity: Not on file   Other Topics Concern  . Not on file   Social History Narrative      No family history of premature CAD in 1st degree relatives.  Prior to Admission medications   Medication Sig Start Date End Date Taking? Authorizing Provider  ALPRAZolam Duanne Moron) 1 MG tablet Take 0.5 tablets by mouth 2 (two) times daily as needed for anxiety or sleep.  08/23/14  Yes Historical Provider, MD  aspirin 325 MG tablet Take 325 mg by mouth daily.   Yes Historical Provider, MD  diclofenac (VOLTAREN) 75 MG EC tablet Take 75 mg by mouth 2 (two) times daily as needed (pain).  08/17/14  Yes Historical Provider, MD  furosemide (LASIX) 40 MG tablet Take 1 tablet by mouth as needed for fluid or edema.  07/12/14  Yes Historical Provider, MD  glipiZIDE (GLUCOTROL) 10 MG tablet Take 10 mg by mouth 2 (two) times daily. 07/27/14  Yes Historical Provider, MD  medroxyPROGESTERone (DEPO-PROVERA) 150 MG/ML injection Inject 150 mg into the muscle every 3 (three) months.  06/13/14  Yes Historical Provider, MD  metFORMIN (GLUCOPHAGE) 500 MG tablet Take 1 tablet by mouth 2 (two) times daily. 08/09/14  Yes Historical Provider, MD  omeprazole (PRILOSEC) 20 MG capsule Take 20 mg by mouth daily as needed (acid reflux).  07/11/14  Yes Historical Provider, MD  OSENI 25-15 MG TABS Take 1 tablet by mouth daily. 08/13/14  Yes Historical Provider, MD  PROAIR HFA 108 (90 BASE) MCG/ACT inhaler Inhale 2 puffs into the lungs every 6 (six) hours as needed for wheezing or shortness of breath.  08/09/14  Yes Historical Provider, MD     Review of systems complete and found to be negative unless listed above in HPI     Physical exam Blood pressure 110/78, pulse 98, height 5\' 4"  (1.626 m), weight 195 lb (88.451 kg), SpO2 99 %. General: NAD Neck: No JVD, no thyromegaly or thyroid nodule.  Lungs: Clear to auscultation bilaterally with normal respiratory effort. CV: Nondisplaced PMI. Regular rate and rhythm, normal S1/S2, no S3/S4, no murmur.  No peripheral edema.  No carotid bruit. Absent pedal pulses on right.  Abdomen: Soft,  nontender, obese, no distention.  Skin: Intact without lesions or rashes.  Neurologic: Alert and oriented x 3.  Psych: Normal affect. Extremities: No clubbing or cyanosis.  HEENT: Normal.   ECG: Most recent ECG reviewed.  Labs:   Lab Results  Component Value Date   HGB 15.0 09/07/2014   HCT 44.0 09/07/2014    Recent Labs Lab 09/07/14 0704  NA 138  K 3.7  CL 105  BUN 20  CREATININE 0.70  GLUCOSE 164*   No results found for: CKTOTAL, CKMB, CKMBINDEX, TROPONINI No results found for: CHOL No results found for: HDL No results found for: LDLCALC No results  found for: TRIG No results found for: CHOLHDL No results found for: LDLDIRECT       Studies: No results found.  ASSESSMENT AND PLAN:  1. Preoperative risk stratification: As she [place] unable to do any significant walking and it is difficult to assess her exercise tolerance, I will obtain a Lexiscan Cardiolite stress test to assess for ischemia.  2. Tobacco abuse: Cessation counseling provided.  3. PVD: Needs to be on at least moderate, if not high-intensity statin therapy. Says she was prescribed statin by PCP but does not remember name. Will check lipid panel.  Dispo: f/u to be determined.   Signed: Kate Sable, M.D., F.A.C.C.  09/10/2014, 1:20 PM

## 2014-09-11 MED FILL — Heparin Sodium (Porcine) 2 Unit/ML in Sodium Chloride 0.9%: INTRAMUSCULAR | Qty: 1000 | Status: AC

## 2014-09-11 MED FILL — Lidocaine HCl Local Preservative Free (PF) Inj 1%: INTRAMUSCULAR | Qty: 30 | Status: AC

## 2014-09-12 ENCOUNTER — Encounter (HOSPITAL_COMMUNITY)
Admission: RE | Admit: 2014-09-12 | Discharge: 2014-09-12 | Disposition: A | Discharge status: Home / Self Care | Payer: BLUE CROSS/BLUE SHIELD | Source: Ambulatory Visit | Attending: Cardiovascular Disease | Admitting: Cardiovascular Disease

## 2014-09-12 ENCOUNTER — Encounter (HOSPITAL_COMMUNITY): Payer: Self-pay

## 2014-09-12 ENCOUNTER — Inpatient Hospital Stay (HOSPITAL_COMMUNITY)
Admission: RE | Admit: 2014-09-12 | Discharge: 2014-09-12 | Disposition: A | Discharge status: Home / Self Care | Payer: Self-pay | Source: Ambulatory Visit

## 2014-09-12 DIAGNOSIS — Z01818 Encounter for other preprocedural examination: Secondary | ICD-10-CM | POA: Insufficient documentation

## 2014-09-12 LAB — NM MYOCAR MULTI W/SPECT W/WALL MOTION / EF
CHL CUP NUCLEAR SRS: 2
CHL CUP NUCLEAR SSS: 6
CHL CUP RESTING HR STRESS: 81 {beats}/min
LV dias vol: 77 mL
LV sys vol: 29 mL
Nuc Stress EF: 63 %
Peak HR: 108 {beats}/min
SDS: 4
TID: 1.04

## 2014-09-12 MED ORDER — TECHNETIUM TC 99M SESTAMIBI GENERIC - CARDIOLITE
30.0000 | Freq: Once | INTRAVENOUS | Status: AC | PRN
  Administered 2014-09-12: 29 via INTRAVENOUS

## 2014-09-12 MED ORDER — TECHNETIUM TC 99M SESTAMIBI - CARDIOLITE
10.0000 | Freq: Once | INTRAVENOUS | Status: AC | PRN
  Administered 2014-09-12: 9 via INTRAVENOUS

## 2014-09-12 MED ORDER — REGADENOSON 0.4 MG/5ML IV SOLN
0.4000 mg | Freq: Once | INTRAVENOUS | Status: AC | PRN
  Administered 2014-09-12: 0.4 mg via INTRAVENOUS
  Filled 2014-09-12: qty 5

## 2014-09-12 MED ORDER — SODIUM CHLORIDE 0.9 % IJ SOLN
INTRAMUSCULAR | Status: AC
  Administered 2014-09-12: 10 mL via INTRAVENOUS
  Filled 2014-09-12: qty 3

## 2014-09-12 MED ORDER — SODIUM CHLORIDE 0.9 % IJ SOLN
10.0000 mL | INTRAMUSCULAR | Status: DC | PRN
  Administered 2014-09-12: 10 mL via INTRAVENOUS
  Filled 2014-09-12: qty 10

## 2014-09-12 MED ORDER — REGADENOSON 0.4 MG/5ML IV SOLN
INTRAVENOUS | Status: AC
  Administered 2014-09-12: 0.4 mg via INTRAVENOUS
  Filled 2014-09-12: qty 5

## 2014-09-12 NOTE — Telephone Encounter (Signed)
09/11/14 [employer] [place] [phone] NO PRECERT REQUIRED PER CASSANDRA N. REF# [id number]

## 2014-09-14 ENCOUNTER — Encounter (HOSPITAL_COMMUNITY): Payer: Self-pay

## 2014-09-14 ENCOUNTER — Encounter (HOSPITAL_COMMUNITY)
Admission: RE | Admit: 2014-09-14 | Discharge: 2014-09-14 | Disposition: A | Discharge status: Home / Self Care | Payer: BLUE CROSS/BLUE SHIELD | Source: Ambulatory Visit | Attending: Vascular Surgery | Admitting: Vascular Surgery

## 2014-09-14 ENCOUNTER — Telehealth: Payer: Self-pay | Admitting: *Deleted

## 2014-09-14 HISTORY — DX: Peripheral vascular disease, unspecified: I73.9

## 2014-09-14 LAB — URINE MICROSCOPIC-ADD ON

## 2014-09-14 LAB — BLOOD GAS, ARTERIAL
ACID-BASE DEFICIT: 6.3 mmol/L — AB (ref 0.0–2.0)
Bicarbonate: 17.6 mEq/L — ABNORMAL LOW (ref 20.0–24.0)
DRAWN BY: 206361
FIO2: 0.21 %
O2 Saturation: 96.8 %
PO2 ART: 118 mmHg — AB (ref 80.0–100.0)
Patient temperature: 98.6
TCO2: 18.4 mmol/L (ref 0–100)
pCO2 arterial: 28.7 mmHg — ABNORMAL LOW (ref 35.0–45.0)
pH, Arterial: 7.404 (ref 7.350–7.450)

## 2014-09-14 LAB — URINALYSIS, ROUTINE W REFLEX MICROSCOPIC
GLUCOSE, UA: NEGATIVE mg/dL
Hgb urine dipstick: NEGATIVE
Ketones, ur: 15 mg/dL — AB
Nitrite: NEGATIVE
Protein, ur: 100 mg/dL — AB
SPECIFIC GRAVITY, URINE: 1.042 — AB (ref 1.005–1.030)
UROBILINOGEN UA: 1 mg/dL (ref 0.0–1.0)
pH: 5 (ref 5.0–8.0)

## 2014-09-14 LAB — COMPREHENSIVE METABOLIC PANEL
ALBUMIN: 3.8 g/dL (ref 3.5–5.0)
ALT: 61 U/L — ABNORMAL HIGH (ref 14–54)
AST: 48 U/L — AB (ref 15–41)
Alkaline Phosphatase: 87 U/L (ref 38–126)
Anion gap: 13 (ref 5–15)
BILIRUBIN TOTAL: 0.4 mg/dL (ref 0.3–1.2)
BUN: 14 mg/dL (ref 6–20)
CO2: 16 mmol/L — AB (ref 22–32)
Calcium: 9.5 mg/dL (ref 8.9–10.3)
Chloride: 106 mmol/L (ref 101–111)
Creatinine, Ser: 0.56 mg/dL (ref 0.44–1.00)
GFR calc non Af Amer: >60 mL/min (ref >60)
Glucose, Bld: 200 mg/dL — ABNORMAL HIGH (ref 65–99)
Potassium: 3.9 mmol/L (ref 3.5–5.1)
SODIUM: 135 mmol/L (ref 135–145)
Total Protein: 6.6 g/dL (ref 6.5–8.1)

## 2014-09-14 LAB — GLUCOSE, CAPILLARY: Glucose-Capillary: 184 mg/dL — ABNORMAL HIGH (ref 65–99)

## 2014-09-14 LAB — CBC
HCT: 40.9 % (ref 36.0–46.0)
HEMOGLOBIN: 14.2 g/dL (ref 12.0–15.0)
MCH: 35.1 pg — AB (ref 26.0–34.0)
MCHC: 34.7 g/dL (ref 30.0–36.0)
MCV: 101 fL — ABNORMAL HIGH (ref 78.0–100.0)
Platelets: 220 10*3/uL (ref 150–400)
RBC: 4.05 MIL/uL (ref 3.87–5.11)
RDW: 13.4 % (ref 11.5–15.5)
WBC: 10.1 10*3/uL (ref 4.0–10.5)

## 2014-09-14 LAB — TYPE AND SCREEN
ABO/RH(D): O POS
Antibody Screen: NEGATIVE

## 2014-09-14 LAB — SURGICAL PCR SCREEN
MRSA, PCR: NEGATIVE
Staphylococcus aureus: NEGATIVE

## 2014-09-14 LAB — ABO/RH: ABO/RH(D): O POS

## 2014-09-14 LAB — APTT: aPTT: 33 seconds (ref 24–37)

## 2014-09-14 LAB — PROTIME-INR
INR: 0.97 (ref 0.00–1.49)
Prothrombin Time: 13.1 seconds (ref 11.6–15.2)

## 2014-09-14 LAB — HCG, SERUM, QUALITATIVE: PREG SERUM: NEGATIVE

## 2014-09-14 MED ORDER — CHLORHEXIDINE GLUCONATE CLOTH 2 % EX PADS: 6.0000 | MEDICATED_PAD | Freq: Once | CUTANEOUS | Status: DC

## 2014-09-14 MED ORDER — CHLORHEXIDINE GLUCONATE CLOTH 2 % EX PADS
6.0000 | MEDICATED_PAD | Freq: Once | CUTANEOUS | Status: DC
Start: 2014-09-14 — End: 2014-09-15

## 2014-09-14 NOTE — Telephone Encounter (Signed)
Notes Recorded by Laurine Blazer, LPN on 5/88/3254 at 9:82 PM Patient notified.  Notes Recorded by Laurine Blazer, LPN on 6/41/5830 at 9:40 PM Left message to return call.

## 2014-09-14 NOTE — Pre-Procedure Instructions (Signed)
    Haley Dunn  09/14/2014      [id number] - [place], [place] [address] [place] [address] Phone: [phone] Fax: [phone]    Your procedure is scheduled on Monday 09/17/14.  Report to [facility] Admitting at 9 A.M.  Call this number if you have problems the morning of surgery:  [phone]   Remember:  Do not eat food or drink liquids after midnight.  Take these medicines the morning of surgery with A SIP OF WATER   Xanax,prilosec,all inhalers,oseni   Do not wear jewelry, make-up or nail polish.  Do not wear lotions, powders, or perfumes.  You may wear deodorant.  Do not shave 48 hours prior to surgery.  Men may shave face and neck.  Do not bring valuables to the hospital.  [facility] is not responsible for any belongings or valuables.  Contacts, dentures or bridgework may not be worn into surgery.  Leave your suitcase in the car.  After surgery it may be brought to your room.  For patients admitted to the hospital, discharge time will be determined by your treatment team.  Patients discharged the day of surgery will not be allowed to drive home.   Name and phone number of your driver:     Please read over the following fact sheets that you were given. Pain Booklet, Coughing and Deep Breathing, Blood Transfusion Information, MRSA Information and Surgical Site Infection Prevention

## 2014-09-14 NOTE — Telephone Encounter (Signed)
STRESS TEST -     No, does not need follow up.      ----- Message -----    From: Laurine Blazer, LPN    Sent: 12/13/3568  5:58 PM     To: Herminio Commons, MD      No follow up scheduled. Her surgery with Dr. Oneida Alar scheduled for 09/17/2014. When want her back ??      Notes Recorded by Laurine Blazer, LPN on 04/12/7937 at 0:30 PM No follow up scheduled. Her surgery with Dr. Oneida Alar scheduled for 09/17/2014. When want her back ?? Notes Recorded by Herminio Commons, MD on 09/12/2014 at 3:58 PM Low risk. Will discuss further at ov.

## 2014-09-14 NOTE — Progress Notes (Signed)
Anesthesia Chart Review:  Pt is 49 year old female scheduled for fem-fem bypass graft on 09/17/2014 with Dr. Oneida Alar.   PMH includes: DM, PVD, asthma. Current smoker. BMI 34  Medications include: ASA, lasix, metformin, prilosec, alogliptin-pioglitazone  Preoperative labs reviewed.    EKG 09/07/2014: NSR. Minimal voltage criteria for LVH, may be normal variant.  Nuclear stress test 09/12/2014:   There was no ST segment deviation noted during stress.  Defect 1: There is a small defect present in the mid anteroseptal, apical anterior and apical septal location.  This is a low risk study.  Suspected variable soft tissue attenuation affecting the apical anteroseptal wall, less likely mild ischemia. LVEF 63%.  If no changes, I anticipate pt can proceed with surgery as scheduled.   Willeen Cass, FNP-BC [facility]/Anesthesiology Phone: [phone] 09/14/2014 2:31 PM

## 2014-09-16 NOTE — Anesthesia Preprocedure Evaluation (Addendum)
Anesthesia Evaluation  Patient identified by MRN, date of birth, ID band Patient awake    Reviewed: Allergy & Precautions, NPO status , Patient's Chart, lab work & pertinent test results  History of Anesthesia Complications (+) history of anesthetic complications ("woke up too soon")  Airway Mallampati: II  TM Distance: >3 FB Neck ROM: Full    Dental no notable dental hx. (+) Dental Advisory Given   Pulmonary asthma , Current Smoker,  breath sounds clear to auscultation  Pulmonary exam normal       Cardiovascular + Peripheral Vascular Disease Normal cardiovascular examRhythm:Regular Rate:Normal     Neuro/Psych PSYCHIATRIC DISORDERS Anxiety negative neurological ROS     GI/Hepatic Neg liver ROS, GERD-  Medicated and Controlled,  Endo/Other  diabetes, Type 2, Oral Hypoglycemic Agents  Renal/GU negative Renal ROS  negative genitourinary   Musculoskeletal negative musculoskeletal ROS (+)   Abdominal   Peds negative pediatric ROS (+)  Hematology negative hematology ROS (+)   Anesthesia Other Findings   Reproductive/Obstetrics negative OB ROS                            Anesthesia Physical Anesthesia Plan  ASA: III  Anesthesia Plan: General   Post-op Pain Management:    Induction: Intravenous  Airway Management Planned: Oral ETT  Additional Equipment: Arterial line  Intra-op Plan:   Post-operative Plan: Extubation in OR  Informed Consent: I have reviewed the patients History and Physical, chart, labs and discussed the procedure including the risks, benefits and alternatives for the proposed anesthesia with the patient or authorized representative who has indicated his/her understanding and acceptance.   Dental advisory given  Plan Discussed with: CRNA  Anesthesia Plan Comments:        Anesthesia Quick Evaluation

## 2014-09-17 ENCOUNTER — Encounter (HOSPITAL_COMMUNITY)
Admission: RE | Disposition: A | Discharge status: Home / Self Care | Payer: Self-pay | Source: Ambulatory Visit | Attending: Vascular Surgery

## 2014-09-17 ENCOUNTER — Inpatient Hospital Stay (HOSPITAL_COMMUNITY): Payer: BLUE CROSS/BLUE SHIELD | Admitting: Emergency Medicine

## 2014-09-17 ENCOUNTER — Telehealth: Payer: Self-pay | Admitting: Vascular Surgery

## 2014-09-17 ENCOUNTER — Encounter (HOSPITAL_COMMUNITY): Payer: Self-pay | Admitting: Surgery

## 2014-09-17 ENCOUNTER — Inpatient Hospital Stay (HOSPITAL_COMMUNITY)
Admission: RE | Admit: 2014-09-17 | Discharge: 2014-09-19 | DRG: 254 | Disposition: A | Discharge status: Home / Self Care | Payer: BLUE CROSS/BLUE SHIELD | Source: Ambulatory Visit | Attending: Vascular Surgery | Admitting: Vascular Surgery

## 2014-09-17 ENCOUNTER — Inpatient Hospital Stay (HOSPITAL_COMMUNITY): Payer: BLUE CROSS/BLUE SHIELD | Admitting: Anesthesiology

## 2014-09-17 DIAGNOSIS — E119 Type 2 diabetes mellitus without complications: Secondary | ICD-10-CM | POA: Diagnosis present

## 2014-09-17 DIAGNOSIS — F419 Anxiety disorder, unspecified: Secondary | ICD-10-CM | POA: Diagnosis present

## 2014-09-17 DIAGNOSIS — J45909 Unspecified asthma, uncomplicated: Secondary | ICD-10-CM | POA: Diagnosis present

## 2014-09-17 DIAGNOSIS — Z885 Allergy status to narcotic agent status: Secondary | ICD-10-CM

## 2014-09-17 DIAGNOSIS — K219 Gastro-esophageal reflux disease without esophagitis: Secondary | ICD-10-CM | POA: Diagnosis present

## 2014-09-17 DIAGNOSIS — M79604 Pain in right leg: Secondary | ICD-10-CM | POA: Diagnosis present

## 2014-09-17 DIAGNOSIS — I70221 Atherosclerosis of native arteries of extremities with rest pain, right leg: Secondary | ICD-10-CM | POA: Diagnosis present

## 2014-09-17 DIAGNOSIS — Z882 Allergy status to sulfonamides status: Secondary | ICD-10-CM

## 2014-09-17 DIAGNOSIS — F17210 Nicotine dependence, cigarettes, uncomplicated: Secondary | ICD-10-CM | POA: Diagnosis present

## 2014-09-17 DIAGNOSIS — I70213 Atherosclerosis of native arteries of extremities with intermittent claudication, bilateral legs: Secondary | ICD-10-CM

## 2014-09-17 DIAGNOSIS — I739 Peripheral vascular disease, unspecified: Secondary | ICD-10-CM | POA: Diagnosis present

## 2014-09-17 HISTORY — PX: FEMORAL-FEMORAL BYPASS GRAFT: SHX936

## 2014-09-17 LAB — GLUCOSE, CAPILLARY
GLUCOSE-CAPILLARY: 164 mg/dL — AB (ref 65–99)
Glucose-Capillary: 136 mg/dL — ABNORMAL HIGH (ref 65–99)
Glucose-Capillary: 120 mg/dL — ABNORMAL HIGH (ref 65–99)
Glucose-Capillary: 219 mg/dL — ABNORMAL HIGH (ref 65–99)
Glucose-Capillary: 248 mg/dL — ABNORMAL HIGH (ref 65–99)

## 2014-09-17 LAB — CBC
HCT: 37.1 % (ref 36.0–46.0)
Hemoglobin: 12.6 g/dL (ref 12.0–15.0)
MCH: 34.5 pg — ABNORMAL HIGH (ref 26.0–34.0)
MCHC: 34 g/dL (ref 30.0–36.0)
MCV: 101.6 fL — ABNORMAL HIGH (ref 78.0–100.0)
Platelets: 204 10*3/uL (ref 150–400)
RBC: 3.65 MIL/uL — AB (ref 3.87–5.11)
RDW: 13.6 % (ref 11.5–15.5)
WBC: 12.3 10*3/uL — ABNORMAL HIGH (ref 4.0–10.5)

## 2014-09-17 LAB — CREATININE, SERUM
CREATININE: 0.52 mg/dL (ref 0.44–1.00)
GFR calc Af Amer: >60 mL/min (ref >60)
GFR calc non Af Amer: >60 mL/min (ref >60)

## 2014-09-17 SURGERY — CREATION, BYPASS, ARTERIAL, FEMORAL TO FEMORAL, USING GRAFT
Anesthesia: General | Site: Groin | Laterality: Bilateral

## 2014-09-17 MED ORDER — LIDOCAINE HCL (CARDIAC) 20 MG/ML IV SOLN
INTRAVENOUS | Status: AC
  Filled 2014-09-17: qty 5

## 2014-09-17 MED ORDER — SODIUM CHLORIDE 0.9 % IV SOLN: INTRAVENOUS | Status: DC

## 2014-09-17 MED ORDER — FENTANYL CITRATE (PF) 100 MCG/2ML IJ SOLN
INTRAMUSCULAR | Status: DC | PRN
  Administered 2014-09-17: 50 ug via INTRAVENOUS
  Administered 2014-09-17: 100 ug via INTRAVENOUS
  Administered 2014-09-17 (×6): 50 ug via INTRAVENOUS

## 2014-09-17 MED ORDER — SODIUM CHLORIDE 0.9 % IV SOLN: 500.0000 mL | Freq: Once | INTRAVENOUS | Status: AC | PRN

## 2014-09-17 MED ORDER — HYDRALAZINE HCL 20 MG/ML IJ SOLN: 5.0000 mg | INTRAMUSCULAR | Status: DC | PRN

## 2014-09-17 MED ORDER — PROTAMINE SULFATE 10 MG/ML IV SOLN
INTRAVENOUS | Status: AC
  Filled 2014-09-17: qty 25

## 2014-09-17 MED ORDER — ALUM & MAG HYDROXIDE-SIMETH 200-200-20 MG/5ML PO SUSP: 15.0000 mL | ORAL | Status: DC | PRN

## 2014-09-17 MED ORDER — ALPRAZOLAM 0.5 MG PO TABS
0.5000 mg | ORAL_TABLET | Freq: Two times a day (BID) | ORAL | Status: DC | PRN
Start: 2014-09-17 — End: 2014-09-19

## 2014-09-17 MED ORDER — SODIUM CHLORIDE 0.9 % IV SOLN
INTRAVENOUS | Status: DC
  Administered 2014-09-17: 75 mL/h via INTRAVENOUS

## 2014-09-17 MED ORDER — OXYCODONE HCL 5 MG PO TABS: 5.0000 mg | ORAL_TABLET | Freq: Four times a day (QID) | ORAL | Status: DC | PRN

## 2014-09-17 MED ORDER — PROPOFOL 10 MG/ML IV BOLUS
INTRAVENOUS | Status: DC | PRN
  Administered 2014-09-17: 200 mg via INTRAVENOUS

## 2014-09-17 MED ORDER — MORPHINE SULFATE 2 MG/ML IJ SOLN
INTRAMUSCULAR | Status: AC
  Filled 2014-09-17: qty 1

## 2014-09-17 MED ORDER — 0.9 % SODIUM CHLORIDE (POUR BTL) OPTIME
TOPICAL | Status: DC | PRN
  Administered 2014-09-17: 2000 mL

## 2014-09-17 MED ORDER — INSULIN ASPART 100 UNIT/ML ~~LOC~~ SOLN
0.0000 [IU] | Freq: Three times a day (TID) | SUBCUTANEOUS | Status: DC
  Administered 2014-09-18: 3 [IU] via SUBCUTANEOUS
  Administered 2014-09-18: 5 [IU] via SUBCUTANEOUS
  Administered 2014-09-18: 8 [IU] via SUBCUTANEOUS
  Administered 2014-09-19: 3 [IU] via SUBCUTANEOUS

## 2014-09-17 MED ORDER — LABETALOL HCL 5 MG/ML IV SOLN
INTRAVENOUS | Status: DC | PRN
  Administered 2014-09-17: 7.5 mg via INTRAVENOUS

## 2014-09-17 MED ORDER — FUROSEMIDE 40 MG PO TABS
40.0000 mg | ORAL_TABLET | ORAL | Status: DC | PRN
  Filled 2014-09-17: qty 1

## 2014-09-17 MED ORDER — PROPOFOL 10 MG/ML IV BOLUS
INTRAVENOUS | Status: AC
  Filled 2014-09-17: qty 20

## 2014-09-17 MED ORDER — POTASSIUM CHLORIDE CRYS ER 20 MEQ PO TBCR: 20.0000 meq | EXTENDED_RELEASE_TABLET | Freq: Every day | ORAL | Status: DC | PRN

## 2014-09-17 MED ORDER — ROCURONIUM BROMIDE 100 MG/10ML IV SOLN
INTRAVENOUS | Status: DC | PRN
  Administered 2014-09-17: 40 mg via INTRAVENOUS

## 2014-09-17 MED ORDER — GLYCOPYRROLATE 0.2 MG/ML IJ SOLN
INTRAMUSCULAR | Status: DC | PRN
  Administered 2014-09-17: 0.6 mg via INTRAVENOUS

## 2014-09-17 MED ORDER — LABETALOL HCL 5 MG/ML IV SOLN
10.0000 mg | INTRAVENOUS | Status: DC | PRN
  Administered 2014-09-17: 10 mg via INTRAVENOUS
  Filled 2014-09-17 (×3): qty 4

## 2014-09-17 MED ORDER — SENNOSIDES-DOCUSATE SODIUM 8.6-50 MG PO TABS
1.0000 | ORAL_TABLET | Freq: Every evening | ORAL | Status: DC | PRN
  Filled 2014-09-17: qty 1

## 2014-09-17 MED ORDER — ONDANSETRON HCL 4 MG/2ML IJ SOLN
INTRAMUSCULAR | Status: AC
  Filled 2014-09-17: qty 2

## 2014-09-17 MED ORDER — ACETAMINOPHEN 650 MG RE SUPP: 325.0000 mg | RECTAL | Status: DC | PRN

## 2014-09-17 MED ORDER — HEPARIN SODIUM (PORCINE) 1000 UNIT/ML IJ SOLN
INTRAMUSCULAR | Status: DC | PRN
  Administered 2014-09-17: 9000 [IU] via INTRAVENOUS

## 2014-09-17 MED ORDER — MORPHINE SULFATE 2 MG/ML IJ SOLN
2.0000 mg | INTRAMUSCULAR | Status: DC | PRN
  Administered 2014-09-17 – 2014-09-18 (×5): 2 mg via INTRAVENOUS
  Filled 2014-09-17 (×4): qty 1
  Filled 2014-09-17: qty 2

## 2014-09-17 MED ORDER — WHITE PETROLATUM GEL
Status: AC
  Administered 2014-09-17: 1
  Filled 2014-09-17: qty 1

## 2014-09-17 MED ORDER — FENTANYL CITRATE (PF) 250 MCG/5ML IJ SOLN
INTRAMUSCULAR | Status: AC
  Filled 2014-09-17: qty 5

## 2014-09-17 MED ORDER — MIDAZOLAM HCL 2 MG/2ML IJ SOLN
INTRAMUSCULAR | Status: AC
  Filled 2014-09-17: qty 2

## 2014-09-17 MED ORDER — ONDANSETRON HCL 4 MG/2ML IJ SOLN: 4.0000 mg | Freq: Four times a day (QID) | INTRAMUSCULAR | Status: DC | PRN

## 2014-09-17 MED ORDER — LABETALOL HCL 5 MG/ML IV SOLN
5.0000 mg | Freq: Once | INTRAVENOUS | Status: AC
  Administered 2014-09-17: 5 mg via INTRAVENOUS

## 2014-09-17 MED ORDER — ALBUTEROL SULFATE HFA 108 (90 BASE) MCG/ACT IN AERS
INHALATION_SPRAY | RESPIRATORY_TRACT | Status: DC | PRN
  Administered 2014-09-17: 3 via RESPIRATORY_TRACT

## 2014-09-17 MED ORDER — ENOXAPARIN SODIUM 30 MG/0.3ML ~~LOC~~ SOLN
30.0000 mg | SUBCUTANEOUS | Status: DC
  Filled 2014-09-17: qty 0.3

## 2014-09-17 MED ORDER — DEXTROSE 5 % IV SOLN
INTRAVENOUS | Status: AC
  Filled 2014-09-17: qty 1.5

## 2014-09-17 MED ORDER — GUAIFENESIN-DM 100-10 MG/5ML PO SYRP: 15.0000 mL | ORAL_SOLUTION | ORAL | Status: DC | PRN

## 2014-09-17 MED ORDER — DEXTROSE 5 % IV SOLN
1.5000 g | Freq: Two times a day (BID) | INTRAVENOUS | Status: AC
  Administered 2014-09-17 – 2014-09-18 (×2): 1.5 g via INTRAVENOUS
  Filled 2014-09-17 (×2): qty 1.5

## 2014-09-17 MED ORDER — METFORMIN HCL 500 MG PO TABS
500.0000 mg | ORAL_TABLET | Freq: Two times a day (BID) | ORAL | Status: DC
  Administered 2014-09-17 – 2014-09-19 (×4): 500 mg via ORAL
  Filled 2014-09-17 (×5): qty 1

## 2014-09-17 MED ORDER — PHENOL 1.4 % MT LIQD
1.0000 | OROMUCOSAL | Status: DC | PRN
Start: 2014-09-17 — End: 2014-09-19

## 2014-09-17 MED ORDER — PHENYLEPHRINE HCL 10 MG/ML IJ SOLN
10.0000 mg | INTRAMUSCULAR | Status: DC | PRN
  Administered 2014-09-17: 20 ug/min via INTRAVENOUS

## 2014-09-17 MED ORDER — ONDANSETRON HCL 4 MG/2ML IJ SOLN
4.0000 mg | Freq: Once | INTRAMUSCULAR | Status: AC | PRN
  Administered 2014-09-17: 4 mg via INTRAVENOUS

## 2014-09-17 MED ORDER — PROTAMINE SULFATE 10 MG/ML IV SOLN
INTRAVENOUS | Status: DC | PRN
  Administered 2014-09-17: 10 mg via INTRAVENOUS
  Administered 2014-09-17: 80 mg via INTRAVENOUS

## 2014-09-17 MED ORDER — PIOGLITAZONE HCL 15 MG PO TABS
15.0000 mg | ORAL_TABLET | Freq: Every day | ORAL | Status: DC
  Administered 2014-09-18 – 2014-09-19 (×2): 15 mg via ORAL
  Filled 2014-09-17 (×3): qty 1

## 2014-09-17 MED ORDER — LABETALOL HCL 5 MG/ML IV SOLN
INTRAVENOUS | Status: AC
  Filled 2014-09-17: qty 4

## 2014-09-17 MED ORDER — NEOSTIGMINE METHYLSULFATE 10 MG/10ML IV SOLN
INTRAVENOUS | Status: DC | PRN
  Administered 2014-09-17: 4 mg via INTRAVENOUS

## 2014-09-17 MED ORDER — ALBUTEROL SULFATE HFA 108 (90 BASE) MCG/ACT IN AERS
INHALATION_SPRAY | RESPIRATORY_TRACT | Status: AC
  Filled 2014-09-17: qty 6.7

## 2014-09-17 MED ORDER — ONDANSETRON HCL 4 MG/2ML IJ SOLN
INTRAMUSCULAR | Status: DC | PRN
  Administered 2014-09-17: 4 mg via INTRAVENOUS

## 2014-09-17 MED ORDER — OXYCODONE HCL 5 MG PO TABS
5.0000 mg | ORAL_TABLET | ORAL | Status: DC | PRN
  Administered 2014-09-17: 10 mg via ORAL
  Filled 2014-09-17: qty 2

## 2014-09-17 MED ORDER — DEXTROSE 5 % IV SOLN
1.5000 g | INTRAVENOUS | Status: AC
  Administered 2014-09-17: 1.5 g via INTRAVENOUS

## 2014-09-17 MED ORDER — FENTANYL CITRATE (PF) 100 MCG/2ML IJ SOLN
25.0000 ug | INTRAMUSCULAR | Status: DC | PRN
  Administered 2014-09-17 (×4): 25 ug via INTRAVENOUS

## 2014-09-17 MED ORDER — SODIUM CHLORIDE 0.9 % IR SOLN
Status: DC | PRN
  Administered 2014-09-17: 500 mL

## 2014-09-17 MED ORDER — ALBUTEROL SULFATE (2.5 MG/3ML) 0.083% IN NEBU: 3.0000 mL | INHALATION_SOLUTION | Freq: Four times a day (QID) | RESPIRATORY_TRACT | Status: DC | PRN

## 2014-09-17 MED ORDER — ASPIRIN 325 MG PO TABS
325.0000 mg | ORAL_TABLET | Freq: Every day | ORAL | Status: DC
Start: 2014-09-18 — End: 2014-09-19
  Administered 2014-09-18 – 2014-09-19 (×2): 325 mg via ORAL
  Filled 2014-09-17 (×2): qty 1

## 2014-09-17 MED ORDER — LACTATED RINGERS IV SOLN
INTRAVENOUS | Status: DC
  Administered 2014-09-17 (×4): via INTRAVENOUS

## 2014-09-17 MED ORDER — FENTANYL CITRATE (PF) 100 MCG/2ML IJ SOLN
INTRAMUSCULAR | Status: AC
  Filled 2014-09-17: qty 2

## 2014-09-17 MED ORDER — ACETAMINOPHEN 325 MG PO TABS: 325.0000 mg | ORAL_TABLET | ORAL | Status: DC | PRN

## 2014-09-17 MED ORDER — DOCUSATE SODIUM 100 MG PO CAPS
100.0000 mg | ORAL_CAPSULE | Freq: Every day | ORAL | Status: DC
  Administered 2014-09-19: 100 mg via ORAL
  Filled 2014-09-17: qty 1

## 2014-09-17 MED ORDER — LIDOCAINE HCL (CARDIAC) 20 MG/ML IV SOLN
INTRAVENOUS | Status: DC | PRN
  Administered 2014-09-17: 80 mg via INTRAVENOUS

## 2014-09-17 MED ORDER — BISACODYL 10 MG RE SUPP: 10.0000 mg | Freq: Every day | RECTAL | Status: DC | PRN

## 2014-09-17 MED ORDER — MIDAZOLAM HCL 5 MG/5ML IJ SOLN
INTRAMUSCULAR | Status: DC | PRN
  Administered 2014-09-17: 2 mg via INTRAVENOUS

## 2014-09-17 MED ORDER — GLIPIZIDE 10 MG PO TABS
10.0000 mg | ORAL_TABLET | Freq: Two times a day (BID) | ORAL | Status: DC
  Administered 2014-09-18 – 2014-09-19 (×3): 10 mg via ORAL
  Filled 2014-09-17 (×6): qty 1

## 2014-09-17 MED ORDER — WHITE PETROLATUM GEL
Status: AC
  Filled 2014-09-17: qty 1

## 2014-09-17 MED ORDER — ALOGLIPTIN-PIOGLITAZONE 25-15 MG PO TABS: 1.0000 | ORAL_TABLET | Freq: Every day | ORAL | Status: DC

## 2014-09-17 MED ORDER — ROCURONIUM BROMIDE 50 MG/5ML IV SOLN
INTRAVENOUS | Status: AC
  Filled 2014-09-17: qty 1

## 2014-09-17 MED ORDER — METOPROLOL TARTRATE 1 MG/ML IV SOLN: 2.0000 mg | INTRAVENOUS | Status: DC | PRN

## 2014-09-17 MED ORDER — THROMBIN 20000 UNITS EX SOLR
CUTANEOUS | Status: AC
  Filled 2014-09-17: qty 20000

## 2014-09-17 MED ORDER — PANTOPRAZOLE SODIUM 40 MG PO TBEC: 40.0000 mg | DELAYED_RELEASE_TABLET | Freq: Every day | ORAL | Status: DC

## 2014-09-17 SURGICAL SUPPLY — 81 items
ATTRACTOMAT 16X20 MAGNETIC DRP (DRAPES) ×1 IMPLANT
BAG DECANTER FOR FLEXI CONT (MISCELLANEOUS) ×3 IMPLANT
BAG ISL DRAPE 18X18 STRL (DRAPES) ×6
BAG ISOLATION DRAPE 18X18 (DRAPES) ×6 IMPLANT
BLADE SURG 15 STRL LF DISP TIS (BLADE) ×2 IMPLANT
BLADE SURG 15 STRL SS (BLADE) ×4
CANISTER SUCTION 2500CC (MISCELLANEOUS) ×4 IMPLANT
CANNULA VESSEL 3MM 2 BLNT TIP (CANNULA) ×8 IMPLANT
CLIP TI MEDIUM 24 (CLIP) ×4 IMPLANT
CLIP TI WIDE RED SMALL 24 (CLIP) ×4 IMPLANT
DRAIN SNY WOU (WOUND CARE) IMPLANT
DRAPE ISOLATION BAG 18X18 (DRAPES) ×2
ELECT BLADE 6.5 EXT (BLADE) IMPLANT
ELECT CAUTERY BLADE 6.4 (BLADE) ×3 IMPLANT
ELECT REM PT RETURN 9FT ADLT (ELECTROSURGICAL) ×8
ELECTRODE REM PT RTRN 9FT ADLT (ELECTROSURGICAL) ×5 IMPLANT
EVACUATOR SILICONE 100CC (DRAIN) IMPLANT
GAUZE SPONGE 4X4 12PLY STRL (GAUZE/BANDAGES/DRESSINGS) ×3 IMPLANT
GLOVE BIO SURGEON STRL SZ 6.5 (GLOVE) ×6 IMPLANT
GLOVE BIO SURGEON STRL SZ7.5 (GLOVE) ×7 IMPLANT
GLOVE BIOGEL PI IND STRL 6.5 (GLOVE) ×4 IMPLANT
GLOVE BIOGEL PI IND STRL 7.0 (GLOVE) ×2 IMPLANT
GLOVE BIOGEL PI IND STRL 8 (GLOVE) ×2 IMPLANT
GLOVE BIOGEL PI INDICATOR 6.5 (GLOVE) ×2
GLOVE BIOGEL PI INDICATOR 7.0 (GLOVE) ×1
GLOVE BIOGEL PI INDICATOR 8 (GLOVE) ×1
GLOVE ECLIPSE 6.5 STRL STRAW (GLOVE) ×3 IMPLANT
GLOVE SURG SS PI 7.0 STRL IVOR (GLOVE) ×3 IMPLANT
GOWN STRL REUS W/ TWL LRG LVL3 (GOWN DISPOSABLE) ×9 IMPLANT
GOWN STRL REUS W/ TWL XL LVL3 (GOWN DISPOSABLE) ×2 IMPLANT
GOWN STRL REUS W/TWL LRG LVL3 (GOWN DISPOSABLE) ×12
GOWN STRL REUS W/TWL XL LVL3 (GOWN DISPOSABLE) ×4
GRAFT HEMASHIELD 8MM (Vascular Products) ×4 IMPLANT
GRAFT VASC STRG 30X8KNIT (Vascular Products) ×2 IMPLANT
INSERT FOGARTY 61MM (MISCELLANEOUS) ×1 IMPLANT
INSERT FOGARTY SM (MISCELLANEOUS) ×2 IMPLANT
KIT BASIN OR (CUSTOM PROCEDURE TRAY) ×4 IMPLANT
KIT ROOM TURNOVER OR (KITS) ×4 IMPLANT
LIQUID BAND (GAUZE/BANDAGES/DRESSINGS) ×3 IMPLANT
LOOP VESSEL MAXI BLUE (MISCELLANEOUS) ×9 IMPLANT
LOOP VESSEL MINI RED (MISCELLANEOUS) ×6 IMPLANT
NS IRRIG 1000ML POUR BTL (IV SOLUTION) ×8 IMPLANT
PACK AORTA (CUSTOM PROCEDURE TRAY) ×1 IMPLANT
PACK ENDOVASCULAR (PACKS) ×3 IMPLANT
PACK PERIPHERAL VASCULAR (CUSTOM PROCEDURE TRAY) ×1 IMPLANT
PAD ARMBOARD 7.5X6 YLW CONV (MISCELLANEOUS) ×8 IMPLANT
PENCIL BUTTON HOLSTER BLD 10FT (ELECTRODE) ×3 IMPLANT
RETAINER VISCERA MED (MISCELLANEOUS) ×1 IMPLANT
SET COLLECT BLD 21X3/4 12 (NEEDLE) ×3 IMPLANT
SPONGE LAP 18X18 X RAY DECT (DISPOSABLE) IMPLANT
SPONGE SURGIFOAM ABS GEL 100 (HEMOSTASIS) IMPLANT
STAPLER VISISTAT 35W (STAPLE) ×5 IMPLANT
SUT PDS AB 1 TP1 54 (SUTURE) ×2 IMPLANT
SUT PROLENE 3 0 SH DA (SUTURE) ×2 IMPLANT
SUT PROLENE 3 0 SH1 36 (SUTURE) ×2 IMPLANT
SUT PROLENE 5 0 C 1 24 (SUTURE) ×8 IMPLANT
SUT PROLENE 6 0 C 1 30 (SUTURE) ×1 IMPLANT
SUT PROLENE 6 0 CC (SUTURE) ×7 IMPLANT
SUT SILK 2 0 (SUTURE) ×4
SUT SILK 2 0 TIES 17X18 (SUTURE)
SUT SILK 2 0SH CR/8 30 (SUTURE) ×1 IMPLANT
SUT SILK 2-0 18XBRD TIE 12 (SUTURE) ×3 IMPLANT
SUT SILK 2-0 18XBRD TIE BLK (SUTURE) ×1 IMPLANT
SUT SILK 3 0 (SUTURE) ×8
SUT SILK 3 0 TIES 17X18 (SUTURE)
SUT SILK 3-0 18XBRD TIE 12 (SUTURE) ×5 IMPLANT
SUT SILK 3-0 18XBRD TIE BLK (SUTURE) ×1 IMPLANT
SUT SILK 4 0 (SUTURE) ×4
SUT SILK 4-0 18XBRD TIE 12 (SUTURE) ×2 IMPLANT
SUT VIC AB 2-0 CT1 27 (SUTURE) ×8
SUT VIC AB 2-0 CT1 36 (SUTURE) ×5 IMPLANT
SUT VIC AB 2-0 CT1 TAPERPNT 27 (SUTURE) ×6 IMPLANT
SUT VIC AB 3-0 SH 27 (SUTURE) ×8
SUT VIC AB 3-0 SH 27X BRD (SUTURE) ×6 IMPLANT
SUT VICRYL 4-0 PS2 18IN ABS (SUTURE) ×8 IMPLANT
SYR BULB IRRIGATION 50ML (SYRINGE) ×3 IMPLANT
TAPE UMBILICAL COTTON 1/8X30 (MISCELLANEOUS) ×3 IMPLANT
TRAY FOLEY W/METER SILVER 16FR (SET/KITS/TRAYS/PACK) ×4 IMPLANT
TUBING ART PRESS 12 MALE/MALE (MISCELLANEOUS) ×3 IMPLANT
UNDERPAD 30X30 INCONTINENT (UNDERPADS AND DIAPERS) ×4 IMPLANT
WATER STERILE IRR 1000ML POUR (IV SOLUTION) ×5 IMPLANT

## 2014-09-17 NOTE — Transfer of Care (Signed)
Immediate Anesthesia Transfer of Care Note  Patient: Haley Dunn  Procedure(s) Performed: Procedure(s): LEFT FEMORAL-RIGHT FEMORAL ARTERY BYPASS GRAFT (Bilateral)  Patient Location: PACU  Anesthesia Type:General  Level of Consciousness: awake, alert  and oriented  Airway & Oxygen Therapy: Patient Spontanous Breathing and Patient connected to face mask oxygen  Post-op Assessment: Report given to RN and Post -op Vital signs reviewed and stable  Post vital signs: Reviewed and stable  Last Vitals:  Filed Vitals:   09/17/14 1444  BP:   Pulse:   Temp: 36.8 C  Resp:     Complications: No apparent anesthesia complications

## 2014-09-17 NOTE — Op Note (Signed)
Procedure: Left to right femoral-femoral bypass  Preoperative diagnosis: Claudication  Postoperative diagnosis: Same  Anesthesia: Gen.  Assistant: Curt Jews MD, Leontine Locket PA-C  Upper findings: 8 mm Dacron graft  Operative details: After obtaining informed consent, the patient was taken to the operating room. The patient was placed in supine position on the operating room table. After induction of general anesthesia, a Foley catheter was placed. Next the patient was prepped and draped in the usual sterile fashion from the umbilicus to the toes. A longitudinal incision was made in the left groin. The incision was taken through the subcutaneous tissues down to level of a the left femoral artery. The profunda femoris and superficial femoral artery were dissected free circumferentially. These were fairly small. They were soft. The native common femoral artery was also dissected free circumferentially and had some posterior plaque. Vessel loops were placed around all these. The left common femoral artery did have a good quality pulse.  Attention was then turned to the right groin. In similar fashion a longitudinal incision  in the right groin. The common femoral superficial femoral and profunda were all dissected free circumferentially and vessel loops placed around these.  The right common femoral did not have a pulse.  There was also some posterior plaque on this side.  I inserted a butterfly needle into the left common femoral artery and compared this to the left radial artery a line pressure.  The radial pressure was 112/67 the left femoral pressure was 113/65.  Since there was no gradient in the inflow,a subcutaneous tunnel was created over the suprapubic region connecting the left and right groin incisions. An 8 mm Dacron graft was brought through this. The patient was given 9000 units of intravenous heparin. After 2 minutes of circulation time, the common femoral on the left was controlled with a  vessel loop.  The superficial femoral and profunda femoris arteries were controlled with fine bulldog clamps. A longitudinal opening was made in the left common femoral. The 8 mm Dacron graft was spatulated and sewn end of graft to side of the common femoral artery using a running 5 0 prolene suture. Just prior to completion of the anastomosis, it was forebled backbled and thoroughly flushed the anastomosis was secured and clamps released and there was good pulsatile flow in the graft immediately.  Hemostasis was obtained.  Flow was maintained to the left leg while the fem fem was controlled with a fistula clamp.  Attention was then turned to the left groin. The native common femoral artery was controlled proximally with a vessel loop. The profunda and the superficial femoral arteries were controlled with vessel loops. A longitudinal opening was made in the common femoral artery.  There was also some posterior plaque on this side but a reasonable lumen and sewing surface.  The new 8 mm Dacron graft was cut to length and beveled. This was then sewn end of graft to side of native common femoral artery using a running 6-0 Prolene suture. Just prior to completion of the anastomosis it was forebled backbled and thoroughly flushed. the anastomosis was secured; clamps released; and there was pulsatile flow in the graft immediately. The patient had good posterior tibial and dorsalis pedis Doppler signals bilaterally. There was also good pulsatile flow in the distal superficial femoral artery at the level of the groin. The patient was given 90 mg of protamine for heparin reversal. Hemostasis was obtained.  The groin was then closed in multiple layers using running 2 0  and 3 0 Vicryl suture. The skin of both incisions was closed with a 4 0 Vicryl subcuticular stitch. Dermabond was applied to both incisions. The patient tolerated procedure well and there were no complications. The patient was extubated in the operating room  and taken to recovery in stable condition.  Ruta Hinds, MD [facility] Office: [phone] Pager: [phone]

## 2014-09-17 NOTE — H&P (View-Only) (Signed)
[facility] [place] HISTORY AND PHYSICAL   History of Present Illness:  Patient is a 49 y.o. year old female who presents for evaluation of right leg pain.  The patient has had pain in her right leg since December. She fell at that time. She had chronic right knee pain prior to that. However after her fall she began having hip pain on the right side. This has become progressively worse over the last several months. She has now progressed to the point where she has numbness in her right foot. She also has pain in her right foot which worsens at night time. She is unable to walk due to pain on short distance. She currently smokes a half pack of cigarettes per day. She smoked 1 pack per day prior to this. She is trying to quit. Greater than 3 minutes they were spent regarding smoking cessation counseling.  Other medical problems include diabetes, anxiety, asthma all of which are currently controlled.  Past Medical History  Diagnosis Date  . Acid reflux   . Anxiety   . Asthma   . Cataracts, bilateral   . Diabetes mellitus without complication     Past Surgical History  Procedure Laterality Date  . Nose surgery    . Mouth surgery    . Knee surgery    . Breast lumpectomy    . Tonsillectomy  2006    Social History History  Substance Use Topics  . Smoking status: Current Every Day Smoker -- 0.50 packs/day for 30 years    Types: Cigarettes  . Smokeless tobacco: Not on file  . Alcohol Use: 2.4 - 3.6 oz/week    4-6 Cans of beer per week    Family History Family History  Problem Relation Age of Onset  . Cancer Mother   . Deep vein thrombosis Father    she has multiple relatives that a been taken care of by our practice with advanced atherosclerosis  Allergies  Allergies  Allergen Reactions  . Codeine Itching  . Sulfur Rash     Current Outpatient Prescriptions  Medication Sig Dispense Refill  . ALPRAZolam (XANAX) 1 MG tablet Take 0.5 tablets by mouth 2  (two) times daily as needed.  3  . diclofenac (VOLTAREN) 75 MG EC tablet Take 75 mg by mouth 2 (two) times daily.  5  . furosemide (LASIX) 40 MG tablet Take 1 tablet by mouth as needed.  1  . glipiZIDE (GLUCOTROL) 10 MG tablet Take 10 mg by mouth 2 (two) times daily.  1  . metFORMIN (GLUCOPHAGE) 500 MG tablet Take 1 tablet by mouth 2 (two) times daily.  5  . omeprazole (PRILOSEC) 20 MG capsule Take 20 mg by mouth daily.  12  . OSENI 25-15 MG TABS Take 1 tablet by mouth daily.  0   No current facility-administered medications for this visit.    ROS:   General:  No weight loss, Fever, chills  HEENT: No recent headaches, no nasal bleeding, no visual changes, no sore throat  Neurologic: No dizziness, blackouts, seizures. No recent symptoms of stroke or mini- stroke. No recent episodes of slurred speech, or temporary blindness.  Cardiac: No recent episodes of chest pain/pressure, no shortness of breath at rest.  No shortness of breath with exertion.  Denies history of atrial fibrillation or irregular heartbeat  Vascular: + history of rest pain in feet. +history of claudication.  No history of non-healing ulcer, No history of DVT   Pulmonary: No home  oxygen, no productive cough, no hemoptysis,  + asthma or wheezing  Musculoskeletal:  [x ] Arthritis, [x ] Low back pain,  [x ] Joint pain  Hematologic:No history of hypercoagulable state.  No history of easy bleeding.  No history of anemia  Gastrointestinal: No hematochezia or melena,  + gastroesophageal reflux, no trouble swallowing  Urinary: [ ]  chronic Kidney disease, [ ]  on HD - [ ]  MWF or [ ]  TTHS, [ ]  Burning with urination, [ ]  Frequent urination, [ ]  Difficulty urinating;   Skin: No rashes  Psychological: + history of anxiety,  No history of depression   Physical Examination  Filed Vitals:   08/30/14 1429  BP: 161/76  Pulse: 91  Height: 5\' 4"  (1.626 m)  Weight: 199 lb 4.8 oz (90.402 kg)  SpO2: 98%    Body mass index is  34.19 kg/(m^2).  General:  Alert and oriented, no acute distress HEENT: Normal Neck: No bruit or JVD Pulmonary: Clear to auscultation bilaterally Cardiac: Regular Rate and Rhythm without murmur Abdomen: Soft, non-tender, non-distended, no mass, obese Skin: No rash, no ulcer Extremity Pulses:  2+ radial, brachial, negative femoral bilaterally, 2+ left dorsalis pedis, absent popliteal and right dorsalis pedis pulse absent posterior tibial pulses bilaterally Musculoskeletal: No deformity or edema  Neurologic: Upper and lower extremity motor 5/5 and symmetric  DATA: Patient had bilateral ABIs performed on 07/24/2014. 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 She had biphasic flow on the left side also suggesting iliac occlusive disease.  ASSESSMENT:  Right lower extremity claudication now with rest pain.   PLAN:  #1 patient was started on one aspirin daily today.               #2 consideration needs to begin starting the patient on a statin since presence of peripheral arterial disease patients have had improved mortality on a statin.  I will leave this at the discretion of her primary care physician.                #3 patient will be scheduled for aortogram bilateral lower extremity runoff possible intervention on 09/07/2014. Risks benefits possible complications and procedure details were explained to the patient today including but not limited to bleeding infection vessel injury. I also discussed with the patient today that the occlusive disease is extensive she may require an operation rather than a peripheral intervention and this will be scheduled for a later date after further evaluation by cardiology.  Ruta Hinds, MD [facility] [place] Office: [phone] Pager: [phone]

## 2014-09-17 NOTE — Anesthesia Postprocedure Evaluation (Signed)
Anesthesia Post Note  Patient: Haley Dunn  Procedure(s) Performed: Procedure(s) (LRB): LEFT FEMORAL-RIGHT FEMORAL ARTERY BYPASS GRAFT (Bilateral)  Anesthesia type: General  Patient location: PACU  Post pain: Pain level controlled and Adequate analgesia  Post assessment: Post-op Vital signs reviewed, Patient's Cardiovascular Status Stable, Respiratory Function Stable, Patent Airway and Pain level controlled  Last Vitals:  Filed Vitals:   09/17/14 1545  BP:   Pulse: 75  Temp:   Resp: 9    Post vital signs: Reviewed and stable  Level of consciousness: awake, alert  and oriented  Complications: No apparent anesthesia complications

## 2014-09-17 NOTE — Interval H&P Note (Signed)
History and Physical Interval Note:  09/17/2014 10:38 AM  Haley Dunn  has presented today for surgery, with the diagnosis of Peripheral vascular disease with right lower extremity rest pain I70.221  The various methods of treatment have been discussed with the patient and family. After consideration of risks, benefits and other options for treatment, the patient has consented to  Procedure(s): BYPASS GRAFT LEFT FEMORAL-RIGHT FEMORAL ARTERY (Bilateral) INSERTION OF ILIAC STENT, possible (Left) AORTOBIFEMORAL BYPASS GRAFT, possible (Bilateral) as a surgical intervention .  The patient's history has been reviewed, patient examined, no change in status, stable for surgery.  I have reviewed the patient's chart and labs.  Questions were answered to the patient's satisfaction.     Ruta Hinds

## 2014-09-17 NOTE — Anesthesia Procedure Notes (Signed)
Procedure Name: Intubation Performed by: Gean Maidens Pre-anesthesia Checklist: Patient identified, Emergency Drugs available, Patient being monitored, Suction available and Timeout performed Patient Re-evaluated:Patient Re-evaluated prior to inductionOxygen Delivery Method: Circle system utilized Preoxygenation: Pre-oxygenation with 100% oxygen Intubation Type: IV induction Ventilation: Mask ventilation without difficulty Laryngoscope Size: Mac and 3 Grade View: Grade I Tube type: Oral Tube size: 7.0 mm Number of attempts: 1 Placement Confirmation: ETT inserted through vocal cords under direct vision,  positive ETCO2,  CO2 detector and breath sounds checked- equal and bilateral Secured at: 21 cm Tube secured with: Tape Dental Injury: Teeth and Oropharynx as per pre-operative assessment

## 2014-09-17 NOTE — Consult Note (Signed)
PHARMACY CONSULT NOTE--POST-PROCEDURE ANTIBIOTICS   Consult  :  Post Procedure Dosing of:  Zinacef Indication :  Empiric Post-Procedure Prophylaxis   Pharmacy consulted for post-procedure dosing of Zinacef s/p LEFT FEMORAL-RIGHT FEMORAL ARTERY BYPASS GRAFT.    Patient is to receive Zinacef 1.5 gm IV q 12 hours x 2 doses.  Wt 90 kg,  CrCl 93 ml/min.  PLAN:  1. Zinacef 1.5g IV q12h x 2 doses ordered - dosing adjustments are not required. 2. Pharmacy will sign off. Please reconsult if additional assistance is needed.   Thank you for allowing Pharmacy to participate in this patient's care.   Rosalba Totty, Craig Guess,  Pharm.D.,  09/17/2014,  5:06 PM

## 2014-09-17 NOTE — Telephone Encounter (Addendum)
sched appt 10/11/14 @ 1:00  Lm on pt's hm# to inform them of appt  ----- Message from Mena Goes, RN sent at 09/17/2014  3:14 PM EDT ----- Regarding: Schedule   ----- Message -----    From: Gabriel Earing, PA-C    Sent: 09/17/2014   2:23 PM      To: [facility] Charge Pool  S/p fem fem bypass graft 09/17/14.  F/u with Dr. Oneida Alar in 2 weeks.  Thanks

## 2014-09-17 NOTE — Progress Notes (Signed)
       Bilateral groins soft  DP/PT  Palpable left LE DP/PT doppler biphasic   S/P Left to right femoral-femoral bypass Disposition stable  Tyra Gural MAUREEN PA-C

## 2014-09-18 ENCOUNTER — Encounter (HOSPITAL_COMMUNITY): Payer: BLUE CROSS/BLUE SHIELD

## 2014-09-18 ENCOUNTER — Encounter (HOSPITAL_COMMUNITY): Payer: Self-pay | Admitting: Vascular Surgery

## 2014-09-18 LAB — BASIC METABOLIC PANEL
ANION GAP: 8 (ref 5–15)
BUN: 7 mg/dL (ref 6–20)
CALCIUM: 8.4 mg/dL — AB (ref 8.9–10.3)
CO2: 24 mmol/L (ref 22–32)
CREATININE: 0.44 mg/dL (ref 0.44–1.00)
Chloride: 103 mmol/L (ref 101–111)
GFR calc Af Amer: >60 mL/min (ref >60)
GFR calc non Af Amer: >60 mL/min (ref >60)
Glucose, Bld: 177 mg/dL — ABNORMAL HIGH (ref 65–99)
Potassium: 4.2 mmol/L (ref 3.5–5.1)
Sodium: 135 mmol/L (ref 135–145)

## 2014-09-18 LAB — CBC
HEMATOCRIT: 36.4 % (ref 36.0–46.0)
Hemoglobin: 12.2 g/dL (ref 12.0–15.0)
MCH: 34.1 pg — ABNORMAL HIGH (ref 26.0–34.0)
MCHC: 33.5 g/dL (ref 30.0–36.0)
MCV: 101.7 fL — ABNORMAL HIGH (ref 78.0–100.0)
Platelets: 215 10*3/uL (ref 150–400)
RBC: 3.58 MIL/uL — ABNORMAL LOW (ref 3.87–5.11)
RDW: 13.4 % (ref 11.5–15.5)
WBC: 9.4 10*3/uL (ref 4.0–10.5)

## 2014-09-18 LAB — GLUCOSE, CAPILLARY
Glucose-Capillary: 267 mg/dL — ABNORMAL HIGH (ref 65–99)
Glucose-Capillary: 199 mg/dL — ABNORMAL HIGH (ref 65–99)
Glucose-Capillary: 173 mg/dL — ABNORMAL HIGH (ref 65–99)
Glucose-Capillary: 234 mg/dL — ABNORMAL HIGH (ref 65–99)
Glucose-Capillary: 263 mg/dL — ABNORMAL HIGH (ref 65–99)

## 2014-09-18 MED ORDER — ENOXAPARIN SODIUM 40 MG/0.4ML ~~LOC~~ SOLN
40.0000 mg | SUBCUTANEOUS | Status: DC
  Administered 2014-09-18: 40 mg via SUBCUTANEOUS
  Filled 2014-09-18 (×2): qty 0.4

## 2014-09-18 MED ORDER — OXYCODONE-ACETAMINOPHEN 5-325 MG PO TABS
1.0000 | ORAL_TABLET | ORAL | Status: DC | PRN
  Administered 2014-09-18 – 2014-09-19 (×6): 2 via ORAL
  Filled 2014-09-18 (×6): qty 2

## 2014-09-18 MED ORDER — DICLOFENAC SODIUM 75 MG PO TBEC
75.0000 mg | DELAYED_RELEASE_TABLET | Freq: Two times a day (BID) | ORAL | Status: DC | PRN
  Filled 2014-09-18: qty 1

## 2014-09-18 NOTE — Progress Notes (Signed)
UR COMPLETED  

## 2014-09-18 NOTE — Evaluation (Signed)
Occupational Therapy Evaluation and Discharge Patient Details Name: Haley Dunn MRN: [id number] DOB: 12-19-1965 Today's Date: 09/18/2014    History of Present Illness Left to right femoral-femoral bypass   Clinical Impression   This 49 yo female admitted and underwent above presents to acute OT with all education completed and will have needed A at home per her report. No further OT needs, we will sign off.    Follow Up Recommendations  No OT follow up    Equipment Recommendations  None recommended by OT       Precautions / Restrictions Precautions Precautions: None Restrictions Weight Bearing Restrictions: No      Mobility Bed Mobility Overal bed mobility: Needs Assistance Bed Mobility: Rolling;Sidelying to Sit;Sit to Sidelying Rolling: Supervision Sidelying to sit: Min assist     Sit to sidelying: Min assist    Transfers Overall transfer level: Needs assistance Equipment used: 1 person hand held assist Transfers: Sit to/from Stand Sit to Stand: Min assist                   ADL                                         General ADL Comments: Pt will need A for LBB/D until she can bring her legs up to her or bend foreward--she says family can A prn. She is moving well enough to be able to stand in the shower for bathing. to/from and sit<>stand toilet S. She is aware to have a pillow she can hold when she coughs, sneezes, or laughs and to put on her lower stomach area in the car on the way home where the seat belt goes across this area.     Vision Additional Comments: No change from baseline          Pertinent Vitals/Pain Pain Assessment: 0-10 Pain Score: 4  Pain Location: Bil groins with sit<>stand Pain Descriptors / Indicators: Sore Pain Intervention(s): Monitored during session;Premedicated before session     Hand Dominance Right   Extremity/Trunk Assessment Upper Extremity Assessment Upper Extremity Assessment: Overall WFL  for tasks assessed   Lower Extremity Assessment Lower Extremity Assessment: Defer to PT evaluation       Communication Communication Communication: No difficulties   Cognition Arousal/Alertness: Awake/alert Behavior During Therapy: WFL for tasks assessed/performed Overall Cognitive Status: Within Functional Limits for tasks assessed                                Home Living Family/patient expects to be discharged to:: Private residence Living Arrangements: Spouse/significant other Available Help at Discharge: Family;Available 24 hours/day Type of Home: House Home Access: Level entry     Home Layout: One level     Bathroom Shower/Tub: Walk-in Hydrologist: Standard     Home Equipment: None          Prior Functioning/Environment Level of Independence: Independent             OT Diagnosis: Generalized weakness;Acute pain         OT Goals(Current goals can be found in the care plan section) Acute Rehab OT Goals Patient Stated Goal: so happy that I can feel my foot again  OT Frequency:  End of Session Equipment Utilized During Treatment:  (none)  Activity Tolerance: Patient tolerated treatment well Patient left: in bed;with call bell/phone within reach   Time: 1313-1344 OT Time Calculation (min): 31 min Charges:  OT General Charges $OT Visit: 1 Procedure OT Evaluation $Initial OT Evaluation Tier I: 1 Procedure OT Treatments $Self Care/Home Management : 8-22 mins  Almon Register [phone] 09/18/2014, 3:14 PM

## 2014-09-18 NOTE — Evaluation (Signed)
Physical Therapy Evaluation Patient Details Name: Haley Dunn MRN: [id number] DOB: [birth year] Today's Date: 09/18/2014   History of Present Illness  pt s/p left to right femoral-femoral bypass.  Clinical Impression  Pt admitted with/for activity limiting pain, s/p L to R fem-fem BPG.  Pt currently limited functionally due to the problems listed below.  (see problems list.)  Pt will benefit from PT to maximize function and safety to be able to get home safely with available assist of family.     Follow Up Recommendations No PT follow up;Supervision for mobility/OOB    Equipment Recommendations  None recommended by PT    Recommendations for Other Services       Precautions / Restrictions Precautions Precautions: None Restrictions Weight Bearing Restrictions: No      Mobility  Bed Mobility Overal bed mobility: Needs Assistance Bed Mobility: Rolling;Sidelying to Sit Rolling: Supervision Sidelying to sit: Supervision     Sit to sidelying: Min assist General bed mobility comments: slow and guarded, but no assist  Transfers Overall transfer level: Needs assistance Equipment used: 1 person hand held assist Transfers: Sit to/from Stand Sit to Stand: Supervision         General transfer comment: slow and guarded  Ambulation/Gait Ambulation/Gait assistance: Supervision Ambulation Distance (Feet): 800 Feet (or greater from 1 unit to the her new room) Assistive device: None (carried personal items with her.) Gait Pattern/deviations: Step-through pattern Gait velocity: slow and guarded      Stairs            Wheelchair Mobility    Modified Rankin (Stroke Patients Only)       Balance Overall balance assessment: Needs assistance Sitting-balance support: No upper extremity supported Sitting balance-Leahy Scale: Good       Standing balance-Leahy Scale: Fair                               Pertinent Vitals/Pain Pain Assessment: 0-10 Pain  Score: 4  (and higher) Pain Location: Bil groins with sit<>stand Pain Descriptors / Indicators: Sore Pain Intervention(s): Monitored during session    Home Living Family/patient expects to be discharged to:: Private residence Living Arrangements: Spouse/significant other Available Help at Discharge: Family;Available 24 hours/day Type of Home: House Home Access: Level entry     Home Layout: One level Home Equipment: None      Prior Function Level of Independence: Independent               Hand Dominance   Dominant Hand: Right    Extremity/Trunk Assessment   Upper Extremity Assessment: Defer to OT evaluation           Lower Extremity Assessment: Overall WFL for tasks assessed      Cervical / Trunk Assessment: Normal  Communication   Communication: No difficulties  Cognition Arousal/Alertness: Awake/alert Behavior During Therapy: WFL for tasks assessed/performed Overall Cognitive Status: Within Functional Limits for tasks assessed                      General Comments      Exercises        Assessment/Plan    PT Assessment Patient needs continued PT services  PT Diagnosis Difficulty walking   PT Problem List Decreased mobility;Decreased activity tolerance;Pain  PT Treatment Interventions Gait training;Stair training;Functional mobility training;Therapeutic activities;Patient/family education   PT Goals (Current goals can be found in the Care Plan section) Acute Rehab PT Goals Patient  Stated Goal: I'm happy I can walk this far. PT Goal Formulation: With patient Time For Goal Achievement: 09/25/14 Potential to Achieve Goals: Good    Frequency Min 2X/week   Barriers to discharge        Co-evaluation               End of Session   Activity Tolerance: Patient tolerated treatment well Patient left: Other (comment) (sitting EOB while CNA put on tele.) Nurse Communication: Mobility status         Time: 6834-1962 PT Time  Calculation (min) (ACUTE ONLY): 19 min   Charges:   PT Evaluation $Initial PT Evaluation Tier I: 1 Procedure     PT G Codes:        Ger Nicks, Tessie Fass 09/18/2014, 4:32 PM 09/18/2014  Donnella Sham, PT [phone]  (pager)

## 2014-09-18 NOTE — Progress Notes (Signed)
A-line remove per protocol. Pressure dressing in place. Pt tolerated well. Tip intact when catheter removed. Will continue to monitor pt.

## 2014-09-18 NOTE — Progress Notes (Signed)
Report called to RN, 2W, all questions answered, no complaints.  Family and patient aware of transfer and room number.

## 2014-09-18 NOTE — Care Management Note (Signed)
Case Management Note  Patient Details  Name: Haley Dunn MRN: [id number] Date of Birth: Apr 03, 1966  Subjective/Objective:                Pt from home admitted with right leg pain, s/p  Left to right femoral-femoral bypass graft.   Action/Plan: Return to home when medically stable. CM to f/u with d/c disposition.  Expected Discharge Date:                  Expected Discharge Plan:  [place]  In-House Referral:     Discharge planning Services  CM Consult  Post Acute Care Choice:    Choice offered to:     DME Arranged:    DME Agency:     HH Arranged:    [place] Agency:     Status of Service:  In process, will continue to follow  Medicare Important Message Given:    Date Medicare IM Given:    Medicare IM give by:    Date Additional Medicare IM Given:    Additional Medicare Important Message give by:     If discussed at [place] of Stay Meetings, dates discussed:    Additional Comments:  [name]( husband) [phone] Sharin [place], RN 09/18/2014, 10:46 AM

## 2014-09-18 NOTE — Progress Notes (Signed)
RN found pt oob to chair and in 10/10 pain. Pt stated that husband assisted her to chair. But now she is in pain and wants to return to bed. Pt given pain medication and helped back to the bed. RN requested that pt wait for staff assistance next time to help avoid any injury. Groin site are dry clean and intact level 0 no sign of injury. Will continue to monitor.

## 2014-09-18 NOTE — Progress Notes (Addendum)
  Progress Note    09/18/2014 7:22 AM 1 Day Post-Op  Subjective:  C/o pain in her groins; left foot feels better than pre op.    Afebrile HR 70's-90's 801'K-553'Z systolic 48% RA  Filed Vitals:   09/18/14 0302  BP: 144/72  Pulse: 85  Temp: 98.3 F (36.8 C)  Resp: 12    Physical Exam: Cardiac:  regular Lungs:  Non labored Incisions:  Bilateral groin incisions are c/d/i Extremities:  Bilateral feet are warm; right DP is palpable; doppler signals in bilateral DP/PT   CBC    Component Value Date/Time   WBC 9.4 09/18/2014 0430   RBC 3.58* 09/18/2014 0430   HGB 12.2 09/18/2014 0430   HCT 36.4 09/18/2014 0430   PLT 215 09/18/2014 0430   MCV 101.7* 09/18/2014 0430   MCH 34.1* 09/18/2014 0430   MCHC 33.5 09/18/2014 0430   RDW 13.4 09/18/2014 0430    BMET    Component Value Date/Time   NA 135 09/18/2014 0430   K 4.2 09/18/2014 0430   CL 103 09/18/2014 0430   CO2 24 09/18/2014 0430   GLUCOSE 177* 09/18/2014 0430   BUN 7 09/18/2014 0430   CREATININE 0.44 09/18/2014 0430   CALCIUM 8.4* 09/18/2014 0430   GFRNONAA >60 09/18/2014 0430   GFRAA >60 09/18/2014 0430    INR    Component Value Date/Time   INR 0.97 09/14/2014 0946     Intake/Output Summary (Last 24 hours) at 09/18/14 0722 Last data filed at 09/18/14 0000  Gross per 24 hour  Intake   3170 ml  Output   1150 ml  Net   2020 ml     Assessment:  49 y.o. female is s/p:  Left to right femoral-femoral bypass  1 Day Post-Op  Plan: -bypass is patent with audible doppler signals in feet bilaterally -pain is not well controlled.  Will switch OxyIR to Percocet to see if she gets better pain control as she is still requiring IV pain medication; will also order her NSAID as it was held yesterday -DVT prophylaxis:  Lovenox to start later today -transfer to 2 west -PT eval and treat today -possibly home tomorrow   Leontine Locket, PA-C [facility] [phone] 09/18/2014 7:22  AM    Agree with above Stop colace at pt request 1+ DP right 2 + DP left Incisions clean Ambulate most likely home tomorrow  Ruta Hinds, MD [facility] [place]: [phone] Pager: [phone]

## 2014-09-19 ENCOUNTER — Other Ambulatory Visit: Payer: Self-pay | Admitting: *Deleted

## 2014-09-19 ENCOUNTER — Ambulatory Visit (HOSPITAL_COMMUNITY): Admission: RE | Admit: 2014-09-19 | Payer: BLUE CROSS/BLUE SHIELD | Source: Ambulatory Visit

## 2014-09-19 DIAGNOSIS — Z48812 Encounter for surgical aftercare following surgery on the circulatory system: Secondary | ICD-10-CM

## 2014-09-19 DIAGNOSIS — I739 Peripheral vascular disease, unspecified: Secondary | ICD-10-CM

## 2014-09-19 LAB — GLUCOSE, CAPILLARY: GLUCOSE-CAPILLARY: 184 mg/dL — AB (ref 65–99)

## 2014-09-19 MED ORDER — OXYCODONE-ACETAMINOPHEN 5-325 MG PO TABS: 1.0000 | ORAL_TABLET | ORAL | Status: DC | PRN

## 2014-09-19 MED ORDER — OXYCODONE-ACETAMINOPHEN 5-325 MG PO TABS
1.0000 | ORAL_TABLET | ORAL | Status: DC | PRN
  Administered 2014-09-19: 1 via ORAL
  Filled 2014-09-19: qty 1

## 2014-09-19 MED ORDER — SIMVASTATIN 10 MG PO TABS: 10.0000 mg | ORAL_TABLET | Freq: Every day | ORAL | Status: DC

## 2014-09-19 NOTE — Discharge Summary (Signed)
Discharge Summary     Haley ROSEKRANS May 03, 1965 49 y.o. female  [id number]  Admission Date: 09/17/2014  Discharge Date: 09/19/14  Physician: Elam Dutch, MD  Admission Diagnosis: Peripheral vascular disease with right lower extremity rest pain I70.221   HPI:   This is a 49 y.o. female who presents for evaluation of right leg pain. The patient has had pain in her right leg since December. She fell at that time. She had chronic right knee pain prior to that. However after her fall she began having hip pain on the right side. This has become progressively worse over the last several months. She has now progressed to the point where she has numbness in her right foot. She also has pain in her right foot which worsens at night time. She is unable to walk due to pain on short distance. She currently smokes a half pack of cigarettes per day. She smoked 1 pack per day prior to this. She is trying to quit. Greater than 3 minutes they were spent regarding smoking cessation counseling. Other medical problems include diabetes, anxiety, asthma all of which are currently controlled.  Hospital Course:  The patient was admitted to the hospital and taken to the operating room on 09/17/2014 and underwent: Left to right femoral-femoral bypass    The pt tolerated the procedure well and was transported to the PACU in good condition.   By POD 1, she was doing well with a patent bypass, but her pain was not well controlled.  Her OxyIR was switched to Percocet and this gave her better pain control.  By POD 2, she did have palpable DP pulses bilaterally and her incisions are healing nicely.  Wound care and keeping groins dry were discussed with the pt and she expressed understanding.  The remainder of the hospital course consisted of increasing mobilization and increasing intake of solids without difficulty.  CBC    Component Value Date/Time   WBC 9.4 09/18/2014 0430   RBC 3.58* 09/18/2014 0430     HGB 12.2 09/18/2014 0430   HCT 36.4 09/18/2014 0430   PLT 215 09/18/2014 0430   MCV 101.7* 09/18/2014 0430   MCH 34.1* 09/18/2014 0430   MCHC 33.5 09/18/2014 0430   RDW 13.4 09/18/2014 0430    BMET    Component Value Date/Time   NA 135 09/18/2014 0430   K 4.2 09/18/2014 0430   CL 103 09/18/2014 0430   CO2 24 09/18/2014 0430   GLUCOSE 177* 09/18/2014 0430   BUN 7 09/18/2014 0430   CREATININE 0.44 09/18/2014 0430   CALCIUM 8.4* 09/18/2014 0430   GFRNONAA >60 09/18/2014 0430   GFRAA >60 09/18/2014 0430           Discharge Instructions    Call MD for:  redness, tenderness, or signs of infection (pain, swelling, bleeding, redness, odor or green/yellow discharge around incision site)    Complete by:  As directed      Call MD for:  severe or increased pain, loss or decreased feeling  in affected limb(s)    Complete by:  As directed      Call MD for:  temperature >100.5    Complete by:  As directed      Discharge wound care:    Complete by:  As directed   Wash the groin wound with soap and water daily and pat dry. (No tub bath-only shower)  Then put a dry gauze or washcloth there to keep this area dry daily  and as needed.  Do not use Vaseline or neosporin on your incisions.  Only use soap and water on your incisions and then protect and keep dry.     Driving Restrictions    Complete by:  As directed   No driving for 2 weeks     Lifting restrictions    Complete by:  As directed   No lifting for 4 weeks     Resume previous diet    Complete by:  As directed            Discharge Diagnosis:  Peripheral vascular disease with right lower extremity rest pain I70.221  Secondary Diagnosis: Patient Active Problem List   Diagnosis Date Noted  . PAD (peripheral artery disease) 09/17/2014  . PVD (peripheral vascular disease) 09/10/2014   Past Medical History  Diagnosis Date  . Acid reflux   . Anxiety   . Asthma   . Cataracts, bilateral   . Diabetes mellitus without  complication   . Complication of anesthesia     pt stated she woke up to soon.  . Peripheral vascular disease        Medication List    TAKE these medications        ALPRAZolam 1 MG tablet  Commonly known as:  XANAX  Take 0.5 tablets by mouth 2 (two) times daily as needed for anxiety or sleep.     aspirin 325 MG tablet  Take 325 mg by mouth daily.     diclofenac 75 MG EC tablet  Commonly known as:  VOLTAREN  Take 75 mg by mouth 2 (two) times daily as needed (pain).     furosemide 40 MG tablet  Commonly known as:  LASIX  Take 1 tablet by mouth as needed for fluid or edema.     glipiZIDE 10 MG tablet  Commonly known as:  GLUCOTROL  Take 10 mg by mouth 2 (two) times daily.     medroxyPROGESTERone 150 MG/ML injection  Commonly known as:  DEPO-PROVERA  Inject 150 mg into the muscle every 3 (three) months.     metFORMIN 500 MG tablet  Commonly known as:  GLUCOPHAGE  Take 1 tablet by mouth 2 (two) times daily.     omeprazole 20 MG capsule  Commonly known as:  PRILOSEC  Take 20 mg by mouth daily as needed (acid reflux).     OSENI 25-15 MG Tabs  Generic drug:  Alogliptin-Pioglitazone  Take 1 tablet by mouth daily.     oxyCODONE-acetaminophen 5-325 MG per tablet  Commonly known as:  PERCOCET/ROXICET  Take 1 tablet by mouth every 4 (four) hours as needed for moderate pain.     PROAIR HFA 108 (90 BASE) MCG/ACT inhaler  Generic drug:  albuterol  Inhale 2 puffs into the lungs every 6 (six) hours as needed for wheezing or shortness of breath.     simvastatin 10 MG tablet  Commonly known as:  ZOCOR  Take 1 tablet (10 mg total) by mouth daily.        Prescriptions given: 1.  Percocet #30 No Refill  Instructions: 1.  Wash the groin wound with soap and water daily and pat dry. (No tub bath-only shower)  Then put a dry gauze or washcloth there to keep this area dry daily and as needed.  Do not use Vaseline or neosporin on your incisions.  Only use soap and water on your  incisions and then protect and keep dry.  Disposition: home  Patient's condition: is  Good  Follow up: 1. Dr. Oneida Alar in 2 weeks   Leontine Locket, PA-C [facility] [phone] 09/19/2014  7:35 AM  - For VQI Registry use --- Instructions: Press F2 to tab through selections.  Delete question if not applicable.   Post-op:  Wound infection: No  Graft infection: No  Transfusion: No  If yes, n/a units given New Arrhythmia: No Ipsilateral amputation: No, [ ]  Minor, [ ]  BKA, [ ]  AKA Discharge patency: [x ] Primary, [ ]  Primary assisted, [ ]  Secondary, [ ]  Occluded Patency judged by: [ ]  Dopper only, [ ]  Palpable graft pulse, [x ] Palpable distal pulse, [ ]  ABI inc. > 0.15, [ ]  Duplex Discharge ABI: R not done, L  Discharge TBI: R , L  D/C Ambulatory Status: Ambulatory  Complications: MI: No, [ ]  Troponin only, [ ]  EKG or Clinical CHF: No Resp failure:No, [ ]  Pneumonia, [ ]  Ventilator Chg in renal function: No, [ ]  Inc. Cr > 0.5, [ ]  Temp. Dialysis, [ ]  Permanent dialysis Stroke: No, [ ]  Minor, [ ]  Major Return to OR: No  Reason for return to OR: [ ]  Bleeding, [ ]  Infection, [ ]  Thrombosis, [ ]  Revision  Discharge medications: Statin use:  yes ASA use:  yes Plavix use:  no Beta blocker use: no Coumadin use: no

## 2014-09-19 NOTE — Progress Notes (Addendum)
  Progress Note    09/19/2014 7:25 AM 2 Days Post-Op  Subjective:  Pain better controlled  Tm 99.1 now afebrile HR 80's-90's NSR 413'K-440'N systolic 02% RA  Filed Vitals:   09/19/14 0404  BP: 129/62  Pulse: 91  Temp: 98.9 F (37.2 C)  Resp: 19    Physical Exam: Cardiac:  regular Lungs:  Non labored Incisions:  Healing nicely Extremities:  2+ palpable DP pulses bilaterally   CBC    Component Value Date/Time   WBC 9.4 09/18/2014 0430   RBC 3.58* 09/18/2014 0430   HGB 12.2 09/18/2014 0430   HCT 36.4 09/18/2014 0430   PLT 215 09/18/2014 0430   MCV 101.7* 09/18/2014 0430   MCH 34.1* 09/18/2014 0430   MCHC 33.5 09/18/2014 0430   RDW 13.4 09/18/2014 0430    BMET    Component Value Date/Time   NA 135 09/18/2014 0430   K 4.2 09/18/2014 0430   CL 103 09/18/2014 0430   CO2 24 09/18/2014 0430   GLUCOSE 177* 09/18/2014 0430   BUN 7 09/18/2014 0430   CREATININE 0.44 09/18/2014 0430   CALCIUM 8.4* 09/18/2014 0430   GFRNONAA >60 09/18/2014 0430   GFRAA >60 09/18/2014 0430    INR    Component Value Date/Time   INR 0.97 09/14/2014 0946     Intake/Output Summary (Last 24 hours) at 09/19/14 0725 Last data filed at 09/18/14 2345  Gross per 24 hour  Intake   1060 ml  Output    800 ml  Net    260 ml     Assessment:  49 y.o. female is s/p:  Left to right femoral-femoral bypass  2 Days Post-Op  Plan: -patent bypass graft with palpable DP's bilaterally -pain better controlled with Percocet -DVT prophylaxis:  Lovenox -discharge home today   Leontine Locket, PA-C [facility] [phone] 09/19/2014 7:25 AM    Agree with above. For D/C today.  Deitra Mayo, MD, [place] [phone] Office: [phone]

## 2014-09-19 NOTE — Progress Notes (Signed)
Discharge instructions given. Pt verbalized understanding and all questions were answered.  

## 2014-09-19 NOTE — Progress Notes (Signed)
PT Cancellation Note  Patient Details Name: Haley Dunn MRN: [id number] DOB: 12/03/1965   Cancelled Treatment:    Reason Eval/Treat Not Completed: Patient declined, no reason specified Spoke with patient who reports she has ambulated multiple times since yesterday. Politely declines physical therapy this AM. States she feels ready to go home and has no further questions for therapy. Will continue to follow however, based on patient's strong abilities yesterday, she appears appropriate for d/c from a PT standpoint.  Ellouise Newer 09/19/2014, 9:40 AM Elayne Snare, [place]

## 2014-09-24 ENCOUNTER — Telehealth: Payer: Self-pay

## 2014-09-24 DIAGNOSIS — G8918 Other acute postprocedural pain: Secondary | ICD-10-CM

## 2014-09-24 MED ORDER — OXYCODONE-ACETAMINOPHEN 5-325 MG PO TABS: 1.0000 | ORAL_TABLET | ORAL | Status: DC | PRN

## 2014-09-24 NOTE — Telephone Encounter (Signed)
Phone call from pt.  Requested she needs a refill on her Percocet.  Stated she has been taking 1.5 tablets at a time.  Rates pain at a 3/10.  Stated her incisions in bilat. groin are intact; reported the incisions look fine, except the right side is slightly red around the incision.  Stated "that's the side I get in and out of bed from, so it is a little more irritated."  Reported a small amt. of watery blood-tinged drainage from the right groin incision.  Denied fever/ chills.  Discussed with Gerri Lins, PA.  Rec'd verbal order to refill Percocet 5/325 mg; # 30; no refills.

## 2014-10-05 ENCOUNTER — Encounter: Payer: Self-pay | Admitting: Vascular Surgery

## 2014-10-11 ENCOUNTER — Ambulatory Visit (INDEPENDENT_AMBULATORY_CARE_PROVIDER_SITE_OTHER): Payer: Self-pay | Admitting: Vascular Surgery

## 2014-10-11 ENCOUNTER — Encounter: Payer: Self-pay | Admitting: Vascular Surgery

## 2014-10-11 VITALS — BP 140/86 | HR 98 | Ht 64.0 in | Wt 193.6 lb

## 2014-10-11 DIAGNOSIS — I739 Peripheral vascular disease, unspecified: Secondary | ICD-10-CM

## 2014-10-11 DIAGNOSIS — Z48812 Encounter for surgical aftercare following surgery on the circulatory system: Secondary | ICD-10-CM

## 2014-10-11 NOTE — Progress Notes (Signed)
POST OPERATIVE OFFICE NOTE    CC:  F/u for surgery  HPI:  This is a 49 y.o. female who is s/p Left to right fem-fem bypass.  She states her right leg feels better.  She has had no fever, chills, n/v/d.  She is a little concerned about the incisions.  Her claudication symptoms are improved.  Allergies  Allergen Reactions  . Codeine Itching  . Sulfa Antibiotics Rash    Current Outpatient Prescriptions  Medication Sig Dispense Refill  . ALPRAZolam (XANAX) 1 MG tablet Take 0.5 tablets by mouth 2 (two) times daily as needed for anxiety or sleep.   3  . aspirin 325 MG tablet Take 325 mg by mouth daily.    . diclofenac (VOLTAREN) 75 MG EC tablet Take 75 mg by mouth 2 (two) times daily as needed (pain).   5  . furosemide (LASIX) 40 MG tablet Take 1 tablet by mouth as needed for fluid or edema.   1  . glipiZIDE (GLUCOTROL) 10 MG tablet Take 10 mg by mouth 2 (two) times daily.  1  . medroxyPROGESTERone (DEPO-PROVERA) 150 MG/ML injection Inject 150 mg into the muscle every 3 (three) months.   4  . metFORMIN (GLUCOPHAGE) 500 MG tablet Take 1 tablet by mouth 2 (two) times daily.  5  . omeprazole (PRILOSEC) 20 MG capsule Take 20 mg by mouth daily as needed (acid reflux).   12  . OSENI 25-15 MG TABS Take 1 tablet by mouth daily.  0  . oxyCODONE-acetaminophen (PERCOCET/ROXICET) 5-325 MG per tablet Take 1 tablet by mouth every 4 (four) hours as needed for moderate pain. 30 tablet 0  . pravastatin (PRAVACHOL) 20 MG tablet Take 20 mg by mouth daily.    [name] PROAIR HFA 108 (90 BASE) MCG/ACT inhaler Inhale 2 puffs into the lungs every 6 (six) hours as needed for wheezing or shortness of breath.   3  . simvastatin (ZOCOR) 10 MG tablet Take 1 tablet (10 mg total) by mouth daily. 30 tablet 3   No current facility-administered medications for this visit.     ROS:  See HPI  Physical Exam:  Filed Vitals:   10/11/14 1306  BP: 140/86  Pulse: 98    Incision:  Superficial opening in the superior  incisions, no erythema or drainage. Extremities:  Distally palpable DP Neuro: sensation intact bilateral LE  Assessment/Plan:  This is a 49 y.o. female who is s/p:  Left to right fem-fem bypass.   We recommended she keep dry guaze over the incisions and wash daily with soap and water.  She also needs to walk for exercise and has cut down on smoking from 1 1/2 packs per day to 10 cig. Per day and is trying to stop.  She will f/u in 3 months for repeat ABI's.  If she has concerns and her incisions don't heal over the next 3-4 weeks she will f/u sooner.   Theda Sers, Elidia Bonenfant MAUREEN PA-C [facility]   Clinic MD:  Pt seen and examined with Dr. Oneida Alar  History and exam details as above. Some maceration of the superior aspect of each groin incision. This is very superficial. This should heal spontaneously within a few weeks. The patient will follow-up in 3 months time with repeat ABIs. Try to quit smoking. Greater than 3 minutes today spell regarding smoking cessation counseling. She was informed of risk of recurrent stenosis or graft occlusion if she continues to smoke.  Ruta Hinds, MD [facility] [place] Office: [phone] Pager: [phone]

## 2015-01-15 ENCOUNTER — Encounter: Payer: Self-pay | Admitting: Vascular Surgery

## 2015-01-17 ENCOUNTER — Ambulatory Visit (INDEPENDENT_AMBULATORY_CARE_PROVIDER_SITE_OTHER): Payer: BLUE CROSS/BLUE SHIELD | Admitting: Vascular Surgery

## 2015-01-17 ENCOUNTER — Encounter: Payer: Self-pay | Admitting: Vascular Surgery

## 2015-01-17 ENCOUNTER — Ambulatory Visit (HOSPITAL_COMMUNITY)
Admission: RE | Admit: 2015-01-17 | Discharge: 2015-01-17 | Disposition: A | Discharge status: Home / Self Care | Payer: BLUE CROSS/BLUE SHIELD | Source: Ambulatory Visit | Attending: Vascular Surgery | Admitting: Vascular Surgery

## 2015-01-17 VITALS — BP 141/70 | HR 100 | Ht 64.0 in | Wt 194.8 lb

## 2015-01-17 DIAGNOSIS — I739 Peripheral vascular disease, unspecified: Secondary | ICD-10-CM | POA: Diagnosis not present

## 2015-01-17 DIAGNOSIS — Z48812 Encounter for surgical aftercare following surgery on the circulatory system: Secondary | ICD-10-CM | POA: Diagnosis not present

## 2015-01-17 DIAGNOSIS — E119 Type 2 diabetes mellitus without complications: Secondary | ICD-10-CM | POA: Insufficient documentation

## 2015-01-17 NOTE — Progress Notes (Signed)
[facility] [place] HISTORY AND PHYSICAL    History of Present Illness:  Patient is a 49 y.o. year old female who presents for postoperative follow-up after femoral-femoral bypass. This was left to right femoral-femoral bypass done in June 2016.  The patient still has some pain in her right leg and laps after she has walked for a long distance during the day. She does not complain of claudication however. She had chronic right knee pain prior to that. However after her fall she began having hip pain on the right side. This has become progressively worse over the last several months. She has persistent numbness in the right foot especially the right first toe which was present preoperatively. She currently smokes 2-3 cigarettes per day.. She smoked 1 pack per day prior to this. She is trying to quit. Other medical problems include diabetes, anxiety, asthma all of which are currently controlled. She also complains of some numbness on the inner aspect of her left eye, and the lateral aspect of her right thigh    Past Medical History   Diagnosis  Date   .  Acid reflux     .  Anxiety     .  Asthma     .  Cataracts, bilateral     .  Diabetes mellitus without complication         Past Surgical History   Procedure  Laterality  Date   .  Nose surgery       .  Mouth surgery       .  Knee surgery       .  Breast lumpectomy       .  Tonsillectomy    2006     Social History History   Substance Use Topics   .  Smoking status:  Current Every Day Smoker -- 0.50 packs/day for 30 years       Types:  Cigarettes   .  Smokeless tobacco:  Not on file   .  Alcohol Use:  2.4 - 3.6 oz/week       4-6 Cans of beer per week     Family History Family History   Problem  Relation  Age of Onset   .  Cancer  Mother     .  Deep vein thrombosis  Father      she has multiple relatives that a been taken care of by our practice with advanced atherosclerosis  Allergies    Allergies    Allergen  Reactions   .  Codeine  Itching   .  Sulfur  Rash        Current Outpatient Prescriptions   Medication  Sig  Dispense  Refill   .  ALPRAZolam (XANAX) 1 MG tablet  Take 0.5 tablets by mouth 2 (two) times daily as needed.    3   .  diclofenac (VOLTAREN) 75 MG EC tablet  Take 75 mg by mouth 2 (two) times daily.    5   .  furosemide (LASIX) 40 MG tablet  Take 1 tablet by mouth as needed.    1   .  glipiZIDE (GLUCOTROL) 10 MG tablet  Take 10 mg by mouth 2 (two) times daily.    1   .  metFORMIN (GLUCOPHAGE) 500 MG tablet  Take 1 tablet by mouth 2 (two) times daily.    5   .  omeprazole (PRILOSEC) 20 MG capsule  Take 20 mg by mouth daily.  12   .  OSENI 25-15 MG TABS  Take 1 tablet by mouth daily.    0      No current facility-administered medications for this visit.     ROS:    General:  No weight loss, Fever, chills  HEENT: No recent headaches, no nasal bleeding, no visual changes, no sore throat  Neurologic: No dizziness, blackouts, seizures. No recent symptoms of stroke or mini- stroke. No recent episodes of slurred speech, or temporary blindness.  Cardiac: No recent episodes of chest pain/pressure, no shortness of breath at rest.  No shortness of breath with exertion.  Denies history of atrial fibrillation or irregular heartbeat  Vascular: + history of rest pain in feet. +history of claudication.  No history of non-healing ulcer, No history of DVT    Pulmonary: No home oxygen, no productive cough, no hemoptysis,  + asthma or wheezing  Musculoskeletal:  [x ] Arthritis, [x ] Low back pain,  [x ] Joint pain  Hematologic:No history of hypercoagulable state.  No history of easy bleeding.  No history of anemia  Gastrointestinal: No hematochezia or melena,  + gastroesophageal reflux, no trouble swallowing  Urinary: [ ]  chronic Kidney disease, [ ]  on HD - [ ]  MWF or [ ]  TTHS, [ ]  Burning with urination, [ ]  Frequent urination, [ ]  Difficulty urinating;    Skin: No  rashes  Psychological: + history of anxiety,  No history of depression   Physical Examination    Filed Vitals:   01/17/15 1335  BP: 141/70  Pulse: 100  Height: 5\' 4"  (1.626 m)  Weight: 194 lb 12.8 oz (88.361 kg)  SpO2: 98%    General:  Alert and oriented, no acute distress HEENT: Normal Pulmonary: Clear to auscultation bilaterally Cardiac: Regular Rate and Rhythm without murmur Abdomen: Soft, non-tender, non-distended, no mass, obese Skin: No rash, no ulcer Extremity Pulses:  2+ radial, brachial, 2+ femoral, 2+ dorsalis pedis pulses bilaterally Musculoskeletal: No deformity or edema     Neurologic: Upper and lower extremity motor 5/5 and symmetric  DATA: Patient had bilateral ABIs performed today which were 1.05 on the right 0.99 on the left. Previously these were 0.3 on the right 0.8 on the left. ASSESSMENT:  Right lower extremity claudication resolved still with some neuropathic pain right foot hopefully will resolve over time   PLAN:  follow-up 3 months bilateral ABIs. I offered the patient today a course of physical therapy to try to improve her walking over time. She states she will call back if she wishes to consider this.  Ruta Hinds, MD [facility] [place] Office: [phone] Pager: [phone]

## 2015-01-18 NOTE — Addendum Note (Signed)
Addended by: Mena Goes on: 01/18/2015 02:55 PM   Modules accepted: Orders

## 2015-04-19 ENCOUNTER — Encounter: Payer: Self-pay | Admitting: Vascular Surgery

## 2015-04-25 ENCOUNTER — Ambulatory Visit: Payer: BLUE CROSS/BLUE SHIELD | Admitting: Vascular Surgery

## 2015-04-25 ENCOUNTER — Encounter: Payer: Self-pay | Admitting: Vascular Surgery

## 2015-04-25 ENCOUNTER — Ambulatory Visit (HOSPITAL_COMMUNITY)
Admission: RE | Admit: 2015-04-25 | Discharge: 2015-04-25 | Disposition: A | Discharge status: Home / Self Care | Payer: BLUE CROSS/BLUE SHIELD | Source: Ambulatory Visit | Attending: Vascular Surgery | Admitting: Vascular Surgery

## 2015-04-25 ENCOUNTER — Encounter (HOSPITAL_COMMUNITY): Payer: BLUE CROSS/BLUE SHIELD

## 2015-04-25 ENCOUNTER — Ambulatory Visit (INDEPENDENT_AMBULATORY_CARE_PROVIDER_SITE_OTHER): Payer: BLUE CROSS/BLUE SHIELD | Admitting: Vascular Surgery

## 2015-04-25 VITALS — BP 154/89 | HR 96 | Temp 97.8°F | Resp 16 | Ht 65.0 in | Wt 198.0 lb

## 2015-04-25 DIAGNOSIS — E119 Type 2 diabetes mellitus without complications: Secondary | ICD-10-CM | POA: Insufficient documentation

## 2015-04-25 DIAGNOSIS — Z48812 Encounter for surgical aftercare following surgery on the circulatory system: Secondary | ICD-10-CM

## 2015-04-25 DIAGNOSIS — Z95828 Presence of other vascular implants and grafts: Secondary | ICD-10-CM | POA: Diagnosis not present

## 2015-04-25 DIAGNOSIS — I739 Peripheral vascular disease, unspecified: Secondary | ICD-10-CM

## 2015-04-25 NOTE — Addendum Note (Signed)
Addended by: Mena Goes on: 04/25/2015 04:31 PM   Modules accepted: Orders

## 2015-04-25 NOTE — Progress Notes (Signed)
[facility] [place]  Patient name: Haley Dunn MRN: [id number] DOB: Aug 22, 1965 Sex: female  REASON FOR VISIT: F/U post left-to-right femoral-femoral bypass  HPI: Haley Dunn is a 50 y.o. female s/p left-to-right femoral-femoral bypass dacron graft who presents to the office for her six month follow-up. States that she has been experiencing a "pins and needles" pain sensation down her right leg and into her foot since surgery that is alleviated by rubbing it. States that nothing makes the pain worse and that it is constant. Her right foot has been particularly more sensitive and states that even water touching it causes an unpleasant sensation. Experiences cramping of her great right toe at night that is alleviated by pulling or pushing her toes forward/backward. Occasionally, she will experience a painful sensation in her right lower abdomen near the surgical site which is alleviated by pressure. She has quit smoking as of 03/24/16. Denies any chest pain, SOB, rest pain, claudication pain, and nonhealing wounds. At this time, she has no further complaints or concerns.  Past Medical History  Diagnosis Date  . Acid reflux   . Anxiety   . Asthma   . Cataracts, bilateral   . Diabetes mellitus without complication ([place])   . Complication of anesthesia     pt stated she woke up to soon.  . Peripheral vascular disease ([place])     Family History  Problem Relation Age of Onset  . Cancer Mother   . Deep vein thrombosis Father     SOCIAL HISTORY: Social History  Substance Use Topics  . Smoking status: Former Smoker -- 0.25 packs/day for 30 years    Types: Cigarettes    Quit date: 03/25/2015  . Smokeless tobacco: Never Used  . Alcohol Use: 2.4 - 3.6 oz/week    4-6 Cans of beer per week    Allergies  Allergen Reactions  . Codeine Itching  . Sulfa Antibiotics Rash    Current Outpatient Prescriptions  Medication Sig Dispense Refill  . ALPRAZolam (XANAX) 1 MG  tablet Take 0.5 tablets by mouth 2 (two) times daily as needed for anxiety or sleep.   3  . aspirin 325 MG tablet Take 325 mg by mouth daily.    . diclofenac (VOLTAREN) 75 MG EC tablet Take 75 mg by mouth 2 (two) times daily as needed (pain).   5  . furosemide (LASIX) 40 MG tablet Take 1 tablet by mouth as needed for fluid or edema.   1  . glipiZIDE (GLUCOTROL) 10 MG tablet Take 10 mg by mouth 2 (two) times daily.  1  . medroxyPROGESTERone (DEPO-PROVERA) 150 MG/ML injection Inject 150 mg into the muscle every 3 (three) months.   4  . metFORMIN (GLUCOPHAGE) 500 MG tablet Take 1 tablet by mouth 2 (two) times daily.  5  . omeprazole (PRILOSEC) 20 MG capsule Take 20 mg by mouth daily as needed (acid reflux).   12  . OSENI 25-15 MG TABS Take 1 tablet by mouth daily.  0  . pravastatin (PRAVACHOL) 20 MG tablet Take 20 mg by mouth daily.    [name] PROAIR HFA 108 (90 BASE) MCG/ACT inhaler Inhale 2 puffs into the lungs every 6 (six) hours as needed for wheezing or shortness of breath.   3  . tiZANidine (ZANAFLEX) 4 MG tablet Take 4 mg by mouth 2 (two) times daily as needed.   0  . oxyCODONE-acetaminophen (PERCOCET/ROXICET) 5-325 MG per tablet Take 1 tablet by mouth every 4 (  four) hours as needed for moderate pain. (Patient not taking: Reported on 04/25/2015) 30 tablet 0  . simvastatin (ZOCOR) 10 MG tablet Take 1 tablet (10 mg total) by mouth daily. (Patient not taking: Reported on 04/25/2015) 30 tablet 3   No current facility-administered medications for this visit.    REVIEW OF SYSTEMS:  [X]  denotes positive finding, [ ]  denotes negative finding Cardiac  Comments:  Chest pain or chest pressure:    Shortness of breath upon exertion:    Short of breath when lying flat:    Irregular heart rhythm:        Vascular    Pain in calf, thigh, or hip brought on by ambulation: X Pin and needles sensation down right leg into right foot. Great right toe cramps at night  Pain in feet at night that wakes you up from  your sleep:     Blood clot in your veins:    Leg swelling:         Pulmonary    Oxygen at home:    Productive cough:     Wheezing:         Neurologic    Sudden weakness in arms or legs:     Sudden numbness in arms or legs:     Sudden onset of difficulty speaking or slurred speech:    Temporary loss of vision in one eye:     Problems with dizziness:         Gastrointestinal    Blood in stool:     Vomited blood:         Genitourinary    Burning when urinating:     Blood in urine:        Psychiatric    Major depression:         Hematologic    Bleeding problems:    Problems with blood clotting too easily:        Skin    Rashes or ulcers:        Constitutional    Fever or chills:      PHYSICAL EXAM: Filed Vitals:   04/25/15 1442 04/25/15 1449  BP: 159/88 154/89  Pulse: 94 96  Temp: 97.8 F (36.6 C)   TempSrc: Oral   Resp: 16   Height: 5\' 5"  (1.651 m)   Weight: 198 lb (89.812 kg)   SpO2: 98%     GENERAL: The patient is a well-nourished female, in no acute distress. The vital signs are documented above. CARDIAC: There is a regular rate and rhythm, no carotid bruits VASCULAR: Femoral pulse 2+ bilaterally, PT/DP 2+ bilaterally, feet are warm and well-perfused with brisk capillary refill PULMONARY: There is good air exchange bilaterally without wheezing or rales. ABDOMEN: Soft and non-tender with normal pitched bowel sounds.  MUSCULOSKELETAL: There are no major deformities or cyanosis. NEUROLOGIC: No focal weakness or paresthesias are detected. SKIN: There are no ulcers or rashes noted. PSYCHIATRIC: The patient has a normal affect.  DATA:  ABI- 04/25/15  Left: 0.80-0.94 from 0.99 in 01/17/15  Right: 0.80-0.94 from 1.05 in 01/17/15  Doppler of LE- 04/25/15  PT and DP bilaterally are triphasic  MEDICAL ISSUES: S/P Left-to-Right Femoral-Femoral bypass dacron graft  The pain sensation that the patient is experiencing cannot be explained from a vascular  standpoint since there is evidence of vascularization down the right leg and into the foot. Patient advised to follow-up with her orthopedist Dr. Lorin Mercy in order to further pursue the origin of the pain if  she desires to do so.   Follow-up with Dr. Oneida Alar in 6 months.  Kayla Checkovich, PA-S   History and exam findings as above. Patient has multiple neurologic symptoms in her right leg primarily complaining of pins and needles type sensation. She now has had full revascularization. These symptoms may be related to neuropathy due to chronic ischemia. If so there may not be much of a solution for this. However, this could also be due to nerve root compression so we will have her evaluated by Dr. Lorin Mercy once more to see if he has any thoughts on that there is a nerve root compression problem causing her symptoms since at this point her arterial occlusive disease has been repaired.  She will follow-up for continued surveillance of peripheral arterial disease in 6 months time. We will obtain ABIs at that point as well.  Ruta Hinds, MD [facility] [place] Office: [phone] Pager: [phone]

## 2015-04-25 NOTE — Progress Notes (Signed)
Filed Vitals:   04/25/15 1442 04/25/15 1449  BP: 159/88 154/89  Pulse: 94 96  Temp: 97.8 F (36.6 C)   TempSrc: Oral   Resp: 16   Height: 5\' 5"  (1.651 m)   Weight: 198 lb (89.812 kg)   SpO2: 98%

## 2016-02-21 NOTE — Patient Instructions (Signed)
Your procedure is scheduled on: 03/02/2016   Report to [facility] at  650   AM.  Call this number if you have problems the morning of surgery: [phone]   Do not eat food or drink liquids :After Midnight.      Take these medicines the morning of surgery with A SIP OF WATER: xanax, voltaren, losartan, prilosec, zanaflex. Take your inhaler before you come. DO NOT take ant medicines for diabetes the morning of surgery.   Do not wear jewelry, make-up or nail polish.  Do not wear lotions, powders, or perfumes. You may wear deodorant.  Do not shave 48 hours prior to surgery.  Do not bring valuables to the hospital.  Contacts, dentures or bridgework may not be worn into surgery.  Leave suitcase in the car. After surgery it may be brought to your room.  For patients admitted to the hospital, checkout time is 11:00 AM the day of discharge.   Patients discharged the day of surgery will not be allowed to drive home.  :     Please read over the following fact sheets that you were given: Coughing and Deep Breathing, Surgical Site Infection Prevention, Anesthesia Post-op Instructions and Care and Recovery After Surgery    Cataract A cataract is a clouding of the lens of the eye. When a lens becomes cloudy, vision is reduced based on the degree and nature of the clouding. Many cataracts reduce vision to some degree. Some cataracts make people more near-sighted as they develop. Other cataracts increase glare. Cataracts that are ignored and become worse can sometimes look white. The white color can be seen through the pupil. CAUSES   Aging. However, cataracts may occur at any age, even in newborns.   Certain drugs.   Trauma to the eye.   Certain diseases such as diabetes.   Specific eye diseases such as chronic inflammation inside the eye or a sudden attack of a rare form of glaucoma.   Inherited or acquired medical problems.  SYMPTOMS   Gradual, progressive drop in vision in the affected eye.    Severe, rapid visual loss. This most often happens when trauma is the cause.  DIAGNOSIS  To detect a cataract, an eye doctor examines the lens. Cataracts are best diagnosed with an exam of the eyes with the pupils enlarged (dilated) by drops.  TREATMENT  For an early cataract, vision may improve by using different eyeglasses or stronger lighting. If that does not help your vision, surgery is the only effective treatment. A cataract needs to be surgically removed when vision loss interferes with your everyday activities, such as driving, reading, or watching TV. A cataract may also have to be removed if it prevents examination or treatment of another eye problem. Surgery removes the cloudy lens and usually replaces it with a substitute lens (intraocular lens, IOL).  At a time when both you and your doctor agree, the cataract will be surgically removed. If you have cataracts in both eyes, only one is usually removed at a time. This allows the operated eye to heal and be out of danger from any possible problems after surgery (such as infection or poor wound healing). In rare cases, a cataract may be doing damage to your eye. In these cases, your caregiver may advise surgical removal right away. The vast majority of people who have cataract surgery have better vision afterward. HOME CARE INSTRUCTIONS  If you are not planning surgery, you may be asked to do the following:  Use different eyeglasses.   Use stronger or brighter lighting.   Ask your eye doctor about reducing your medicine dose or changing medicines if it is thought that a medicine caused your cataract. Changing medicines does not make the cataract go away on its own.   Become familiar with your surroundings. Poor vision can lead to injury. Avoid bumping into things on the affected side. You are at a higher risk for tripping or falling.   Exercise extreme care when driving or operating machinery.   Wear sunglasses if you are sensitive  to bright light or experiencing problems with glare.  SEEK IMMEDIATE MEDICAL CARE IF:   You have a worsening or sudden vision loss.   You notice redness, swelling, or increasing pain in the eye.   You have a fever.  Document Released: 03/23/2005 Document Revised: 03/12/2011 Document Reviewed: 11/14/2010 [facility] Patient Information 2012 [place].PATIENT INSTRUCTIONS POST-ANESTHESIA  IMMEDIATELY FOLLOWING SURGERY:  Do not drive or operate machinery for the first twenty four hours after surgery.  Do not make any important decisions for twenty four hours after surgery or while taking narcotic pain medications or sedatives.  If you develop intractable nausea and vomiting or a severe headache please notify your doctor immediately.  FOLLOW-UP:  Please make an appointment with your surgeon as instructed. You do not need to follow up with anesthesia unless specifically instructed to do so.  WOUND CARE INSTRUCTIONS (if applicable):  Keep a dry clean dressing on the anesthesia/puncture wound site if there is drainage.  Once the wound has quit draining you may leave it open to air.  Generally you should leave the bandage intact for twenty four hours unless there is drainage.  If the epidural site drains for more than 36-48 hours please call the anesthesia department.  QUESTIONS?:  Please feel free to call your physician or the hospital operator if you have any questions, and they will be happy to assist you.

## 2016-02-24 ENCOUNTER — Encounter (HOSPITAL_COMMUNITY): Payer: Self-pay

## 2016-02-24 ENCOUNTER — Encounter (HOSPITAL_COMMUNITY)
Admission: RE | Admit: 2016-02-24 | Discharge: 2016-02-24 | Disposition: A | Discharge status: Home / Self Care | Payer: BLUE CROSS/BLUE SHIELD | Source: Ambulatory Visit | Attending: Ophthalmology | Admitting: Ophthalmology

## 2016-02-24 DIAGNOSIS — Z01818 Encounter for other preprocedural examination: Secondary | ICD-10-CM | POA: Insufficient documentation

## 2016-02-24 DIAGNOSIS — Z01810 Encounter for preprocedural cardiovascular examination: Secondary | ICD-10-CM | POA: Diagnosis not present

## 2016-02-24 HISTORY — DX: Essential (primary) hypertension: I10

## 2016-02-24 LAB — CBC WITH DIFFERENTIAL/PLATELET
Basophils Absolute: 0 10*3/uL (ref 0.0–0.1)
Basophils Relative: 0 %
Eosinophils Absolute: 0.3 10*3/uL (ref 0.0–0.7)
Eosinophils Relative: 3 %
HEMATOCRIT: 44.7 % (ref 36.0–46.0)
HEMOGLOBIN: 15.3 g/dL — AB (ref 12.0–15.0)
LYMPHS ABS: 2.1 10*3/uL (ref 0.7–4.0)
LYMPHS PCT: 21 %
MCH: 35.2 pg — AB (ref 26.0–34.0)
MCHC: 34.2 g/dL (ref 30.0–36.0)
MCV: 102.8 fL — AB (ref 78.0–100.0)
MONOS PCT: 7 %
Monocytes Absolute: 0.7 10*3/uL (ref 0.1–1.0)
NEUTROS ABS: 6.6 10*3/uL (ref 1.7–7.7)
NEUTROS PCT: 69 %
Platelets: 252 10*3/uL (ref 150–400)
RBC: 4.35 MIL/uL (ref 3.87–5.11)
RDW: 13.3 % (ref 11.5–15.5)
WBC: 9.7 10*3/uL (ref 4.0–10.5)

## 2016-02-24 LAB — BASIC METABOLIC PANEL
Anion gap: 7 (ref 5–15)
BUN: 12 mg/dL (ref 6–20)
CHLORIDE: 107 mmol/L (ref 101–111)
CO2: 24 mmol/L (ref 22–32)
CREATININE: 0.65 mg/dL (ref 0.44–1.00)
Calcium: 9.5 mg/dL (ref 8.9–10.3)
GFR calc Af Amer: >60 mL/min (ref >60)
GFR calc non Af Amer: >60 mL/min (ref >60)
Glucose, Bld: 177 mg/dL — ABNORMAL HIGH (ref 65–99)
Potassium: 4.2 mmol/L (ref 3.5–5.1)
Sodium: 138 mmol/L (ref 135–145)

## 2016-03-02 ENCOUNTER — Ambulatory Visit (HOSPITAL_COMMUNITY)
Admission: RE | Admit: 2016-03-02 | Discharge: 2016-03-02 | Disposition: A | Discharge status: Home / Self Care | Payer: BLUE CROSS/BLUE SHIELD | Source: Ambulatory Visit | Attending: Ophthalmology | Admitting: Ophthalmology

## 2016-03-02 ENCOUNTER — Encounter (HOSPITAL_COMMUNITY): Payer: Self-pay | Admitting: *Deleted

## 2016-03-02 ENCOUNTER — Encounter (HOSPITAL_COMMUNITY)
Admission: RE | Disposition: A | Discharge status: Home / Self Care | Payer: Self-pay | Source: Ambulatory Visit | Attending: Ophthalmology

## 2016-03-02 ENCOUNTER — Ambulatory Visit (HOSPITAL_COMMUNITY): Payer: BLUE CROSS/BLUE SHIELD | Admitting: Anesthesiology

## 2016-03-02 DIAGNOSIS — I10 Essential (primary) hypertension: Secondary | ICD-10-CM | POA: Diagnosis not present

## 2016-03-02 DIAGNOSIS — J45909 Unspecified asthma, uncomplicated: Secondary | ICD-10-CM | POA: Diagnosis not present

## 2016-03-02 DIAGNOSIS — E1136 Type 2 diabetes mellitus with diabetic cataract: Secondary | ICD-10-CM | POA: Diagnosis not present

## 2016-03-02 DIAGNOSIS — Z79899 Other long term (current) drug therapy: Secondary | ICD-10-CM | POA: Insufficient documentation

## 2016-03-02 DIAGNOSIS — I739 Peripheral vascular disease, unspecified: Secondary | ICD-10-CM | POA: Diagnosis not present

## 2016-03-02 DIAGNOSIS — F17200 Nicotine dependence, unspecified, uncomplicated: Secondary | ICD-10-CM | POA: Diagnosis not present

## 2016-03-02 DIAGNOSIS — F419 Anxiety disorder, unspecified: Secondary | ICD-10-CM | POA: Insufficient documentation

## 2016-03-02 DIAGNOSIS — Z7984 Long term (current) use of oral hypoglycemic drugs: Secondary | ICD-10-CM | POA: Insufficient documentation

## 2016-03-02 DIAGNOSIS — K219 Gastro-esophageal reflux disease without esophagitis: Secondary | ICD-10-CM | POA: Diagnosis not present

## 2016-03-02 HISTORY — PX: CATARACT EXTRACTION W/PHACO: SHX586

## 2016-03-02 LAB — GLUCOSE, CAPILLARY: GLUCOSE-CAPILLARY: 203 mg/dL — AB (ref 65–99)

## 2016-03-02 SURGERY — PHACOEMULSIFICATION, CATARACT, WITH IOL INSERTION
Anesthesia: Monitor Anesthesia Care | Site: Eye | Laterality: Right

## 2016-03-02 MED ORDER — PHENYLEPHRINE HCL 2.5 % OP SOLN
1.0000 [drp] | OPHTHALMIC | Status: AC
  Administered 2016-03-02 (×3): 1 [drp] via OPHTHALMIC

## 2016-03-02 MED ORDER — LIDOCAINE 3.5 % OP GEL OPTIME - NO CHARGE
OPHTHALMIC | Status: DC | PRN
  Administered 2016-03-02: 1 [drp] via OPHTHALMIC

## 2016-03-02 MED ORDER — FENTANYL CITRATE (PF) 100 MCG/2ML IJ SOLN
INTRAMUSCULAR | Status: AC
  Filled 2016-03-02: qty 2

## 2016-03-02 MED ORDER — MIDAZOLAM HCL 2 MG/2ML IJ SOLN
INTRAMUSCULAR | Status: AC
  Filled 2016-03-02: qty 2

## 2016-03-02 MED ORDER — TETRACAINE HCL 0.5 % OP SOLN
1.0000 [drp] | OPHTHALMIC | Status: AC
  Administered 2016-03-02 (×3): 1 [drp] via OPHTHALMIC

## 2016-03-02 MED ORDER — LIDOCAINE HCL (PF) 1 % IJ SOLN
INTRAMUSCULAR | Status: DC | PRN
  Administered 2016-03-02: .8 mL

## 2016-03-02 MED ORDER — POVIDONE-IODINE 5 % OP SOLN
OPHTHALMIC | Status: DC | PRN
  Administered 2016-03-02: 1 via OPHTHALMIC

## 2016-03-02 MED ORDER — EPINEPHRINE PF 1 MG/ML IJ SOLN
INTRAOCULAR | Status: DC | PRN
  Administered 2016-03-02: 500 mL

## 2016-03-02 MED ORDER — FENTANYL CITRATE (PF) 100 MCG/2ML IJ SOLN
25.0000 ug | INTRAMUSCULAR | Status: DC | PRN
  Administered 2016-03-02: 25 ug via INTRAVENOUS

## 2016-03-02 MED ORDER — NEOMYCIN-POLYMYXIN-DEXAMETH 3.5-10000-0.1 OP SUSP
OPHTHALMIC | Status: DC | PRN
  Administered 2016-03-02: 1 [drp] via OPHTHALMIC

## 2016-03-02 MED ORDER — CYCLOPENTOLATE-PHENYLEPHRINE 0.2-1 % OP SOLN
1.0000 [drp] | OPHTHALMIC | Status: AC
  Administered 2016-03-02 (×3): 1 [drp] via OPHTHALMIC

## 2016-03-02 MED ORDER — LIDOCAINE HCL 3.5 % OP GEL: 1.0000 "application " | Freq: Once | OPHTHALMIC | Status: DC

## 2016-03-02 MED ORDER — BSS IO SOLN
INTRAOCULAR | Status: DC | PRN
  Administered 2016-03-02: 15 mL

## 2016-03-02 MED ORDER — PROVISC 10 MG/ML IO SOLN
INTRAOCULAR | Status: DC | PRN
  Administered 2016-03-02: 0.85 mL via INTRAOCULAR

## 2016-03-02 MED ORDER — MIDAZOLAM HCL 2 MG/2ML IJ SOLN
1.0000 mg | INTRAMUSCULAR | Status: DC | PRN
  Administered 2016-03-02: 2 mg via INTRAVENOUS

## 2016-03-02 MED ORDER — EPINEPHRINE PF 1 MG/ML IJ SOLN
INTRAMUSCULAR | Status: AC
  Filled 2016-03-02: qty 1

## 2016-03-02 MED ORDER — LACTATED RINGERS IV SOLN
INTRAVENOUS | Status: DC
  Administered 2016-03-02: 1000 mL via INTRAVENOUS

## 2016-03-02 SURGICAL SUPPLY — 11 items
CLOTH BEACON ORANGE TIMEOUT ST (SAFETY) ×2 IMPLANT
EYE SHIELD UNIVERSAL CLEAR (GAUZE/BANDAGES/DRESSINGS) ×2 IMPLANT
GLOVE BIOGEL PI IND STRL 7.0 (GLOVE) IMPLANT
GLOVE BIOGEL PI INDICATOR 7.0 (GLOVE) ×2
GLOVE EXAM NITRILE MD LF STRL (GLOVE) ×2 IMPLANT
PAD ARMBOARD 7.5X6 YLW CONV (MISCELLANEOUS) ×2 IMPLANT
SIGHTPATH CAT PROC W REG LENS (Ophthalmic Related) ×3 IMPLANT
SYRINGE LUER LOK 1CC (MISCELLANEOUS) ×2 IMPLANT
TAPE SURG TRANSPORE 1 IN (GAUZE/BANDAGES/DRESSINGS) IMPLANT
TAPE SURGICAL TRANSPORE 1 IN (GAUZE/BANDAGES/DRESSINGS) ×2
WATER STERILE IRR 250ML POUR (IV SOLUTION) ×2 IMPLANT

## 2016-03-02 NOTE — Anesthesia Postprocedure Evaluation (Signed)
Anesthesia Post Note  Patient: Haley Dunn  Procedure(s) Performed: Procedure(s) (LRB): CATARACT EXTRACTION PHACO AND INTRAOCULAR LENS PLACEMENT (IOC) (Right)  Patient location during evaluation: Short Stay Anesthesia Type: MAC Level of consciousness: awake and alert, oriented and patient cooperative Pain management: pain level controlled Vital Signs Assessment: post-procedure vital signs reviewed and stable Respiratory status: spontaneous breathing, nonlabored ventilation and respiratory function stable Cardiovascular status: blood pressure returned to baseline Postop Assessment: no signs of nausea or vomiting Anesthetic complications: no    Last Vitals:  Vitals:   03/02/16 0830 03/02/16 0845  BP: (!) 146/86 131/74  Resp: (!) 21 19    Last Pain: There were no vitals filed for this visit.               Aviyon Hocevar J

## 2016-03-02 NOTE — Anesthesia Preprocedure Evaluation (Signed)
Anesthesia Evaluation  Patient identified by MRN, date of birth, ID band Patient awake    Reviewed: Allergy & Precautions, NPO status , Patient's Chart, lab work & pertinent test results  History of Anesthesia Complications (+) history of anesthetic complications ("woke up too soon")  Airway Mallampati: II  TM Distance: >3 FB Neck ROM: Full    Dental no notable dental hx. (+) Teeth Intact   Pulmonary asthma , Current Smoker, former smoker,    Pulmonary exam normal breath sounds clear to auscultation       Cardiovascular hypertension, + Peripheral Vascular Disease  Normal cardiovascular exam Rhythm:Regular Rate:Normal     Neuro/Psych PSYCHIATRIC DISORDERS Anxiety negative neurological ROS     GI/Hepatic Neg liver ROS, GERD  Medicated and Controlled,  Endo/Other  diabetes, Type 2, Oral Hypoglycemic Agents  Renal/GU negative Renal ROS  negative genitourinary   Musculoskeletal negative musculoskeletal ROS (+)   Abdominal   Peds negative pediatric ROS (+)  Hematology negative hematology ROS (+)   Anesthesia Other Findings   Reproductive/Obstetrics negative OB ROS                             Anesthesia Physical Anesthesia Plan  ASA: III  Anesthesia Plan: MAC   Post-op Pain Management:    Induction: Intravenous  Airway Management Planned: Nasal Cannula  Additional Equipment:   Intra-op Plan:   Post-operative Plan:   Informed Consent: I have reviewed the patients History and Physical, chart, labs and discussed the procedure including the risks, benefits and alternatives for the proposed anesthesia with the patient or authorized representative who has indicated his/her understanding and acceptance.     Plan Discussed with:   Anesthesia Plan Comments:         Anesthesia Quick Evaluation

## 2016-03-02 NOTE — Op Note (Signed)
Date of Admission: 03/02/2016  Date of Surgery: 03/02/2016   Pre-Op Dx: Cataract Right Eye  Post-Op Dx: Senile Posterior Subcapsular Cataract Right  Eye,  Dx Code [id number]  Surgeon: Tonny Branch, M.D.  Assistants: None  Anesthesia: Topical with MAC  Indications: Painless, progressive loss of vision with compromise of daily activities.  Surgery: Cataract Extraction with Intraocular lens Implant Right Eye  Discription: The patient had dilating drops and viscous lidocaine placed into the Right eye in the pre-op holding area. After transfer to the operating room, a time out was performed. The patient was then prepped and draped. Beginning with a 57 degree blade a paracentesis port was made at the surgeon's 2 o'clock position. The anterior chamber was then filled with 1% non-preserved lidocaine. This was followed by filling the anterior chamber with Provisc.  A 2.34mm keratome blade was used to make a clear corneal incision at the temporal limbus.  A bent cystatome needle was used to create a continuous tear capsulotomy. Hydrodissection was performed with balanced salt solution on a Fine canula. The lens nucleus was then removed using the phacoemulsification handpiece. Residual cortex was removed with the I&A handpiece. The anterior chamber and capsular bag were refilled with Provisc. A posterior chamber intraocular lens was placed into the capsular bag with it's injector. The implant was positioned with the Kuglan hook. The Provisc was then removed from the anterior chamber and capsular bag with the I&A handpiece. Stromal hydration of the main incision and paracentesis port was performed with BSS on a Fine canula. The wounds were tested for leak which was negative. The patient tolerated the procedure well. There were no operative complications. The patient was then transferred to the recovery room in stable condition.  Complications: None  Specimen: None  EBL: None  Prosthetic device: Hoya iSert  250, power 22.0 D, SN [id number].

## 2016-03-02 NOTE — Transfer of Care (Signed)
Immediate Anesthesia Transfer of Care Note  Patient: Haley Dunn  Procedure(s) Performed: Procedure(s) with comments: CATARACT EXTRACTION PHACO AND INTRAOCULAR LENS PLACEMENT (IOC) (Right) - CDE:6.38  Patient Location: Short Stay  Anesthesia Type:MAC  Level of Consciousness: awake, alert  and patient cooperative  Airway & Oxygen Therapy: Patient Spontanous Breathing  Post-op Assessment: Report given to RN, Post -op Vital signs reviewed and stable and Patient moving all extremities  Post vital signs: Reviewed and stable  Last Vitals:  Vitals:   03/02/16 0830 03/02/16 0845  BP: (!) 146/86 131/74  Resp: (!) 21 19    Last Pain: There were no vitals filed for this visit.       Complications: No apparent anesthesia complications

## 2016-03-02 NOTE — Discharge Instructions (Signed)
Anesthesia, Adult, Care After °These instructions provide you with information about caring for yourself after your procedure. Your health care provider may also give you more specific instructions. Your treatment has been planned according to current medical practices, but problems sometimes occur. Call your health care provider if you have any problems or questions after your procedure. °What can I expect after the procedure? °After the procedure, it is common to have: °· Vomiting. °· A sore throat. °· Mental slowness. °It is common to feel: °· Nauseous. °· Cold or shivery. °· Sleepy. °· Tired. °· Sore or achy, even in parts of your body where you did not have surgery. °Follow these instructions at home: °For at least 24 hours after the procedure: °· Do not: °¨ Participate in activities where you could fall or become injured. °¨ Drive. °¨ Use heavy machinery. °¨ Drink alcohol. °¨ Take sleeping pills or medicines that cause drowsiness. °¨ Make important decisions or sign legal documents. °¨ Take care of children on your own. °· Rest. °Eating and drinking °· If you vomit, drink water, juice, or soup when you can drink without vomiting. °· Drink enough fluid to keep your urine clear or pale yellow. °· Make sure you have little or no nausea before eating solid foods. °· Follow the diet recommended by your health care provider. °General instructions °· Have a responsible adult stay with you until you are awake and alert. °· Return to your normal activities as told by your health care provider. Ask your health care provider what activities are safe for you. °· Take over-the-counter and prescription medicines only as told by your health care provider. °· If you smoke, do not smoke without supervision. °· Keep all follow-up visits as told by your health care provider. This is important. °Contact a health care provider if: °· You continue to have nausea or vomiting at home, and medicines are not helpful. °· You cannot  drink fluids or start eating again. °· You cannot urinate after 8-12 hours. °· You develop a skin rash. °· You have fever. °· You have increasing redness at the site of your procedure. °Get help right away if: °· You have difficulty breathing. °· You have chest pain. °· You have unexpected bleeding. °· You feel that you are having a life-threatening or urgent problem. °This information is not intended to replace advice given to you by your health care provider. Make sure you discuss any questions you have with your health care provider. °Document Released: 06/29/2000 Document Revised: 08/26/2015 Document Reviewed: 03/07/2015 °Elsevier Interactive Patient Education © 2017 [employer] ° °

## 2016-03-02 NOTE — H&P (Signed)
I have reviewed the H&P, the patient was re-examined, and I have identified no interval changes in medical condition and plan of care since the history and physical of record  

## 2016-03-05 ENCOUNTER — Encounter (HOSPITAL_COMMUNITY): Payer: Self-pay | Admitting: Ophthalmology

## 2016-04-23 ENCOUNTER — Encounter: Payer: Self-pay | Admitting: Vascular Surgery

## 2016-04-30 ENCOUNTER — Ambulatory Visit (INDEPENDENT_AMBULATORY_CARE_PROVIDER_SITE_OTHER): Payer: BLUE CROSS/BLUE SHIELD | Admitting: Vascular Surgery

## 2016-04-30 ENCOUNTER — Ambulatory Visit (HOSPITAL_COMMUNITY)
Admission: RE | Admit: 2016-04-30 | Discharge: 2016-04-30 | Disposition: A | Discharge status: Home / Self Care | Payer: BLUE CROSS/BLUE SHIELD | Source: Ambulatory Visit | Attending: Vascular Surgery | Admitting: Vascular Surgery

## 2016-04-30 ENCOUNTER — Encounter: Payer: Self-pay | Admitting: Vascular Surgery

## 2016-04-30 VITALS — BP 134/83 | HR 89 | Temp 98.0°F | Resp 20 | Ht 65.0 in | Wt 193.5 lb

## 2016-04-30 DIAGNOSIS — Z87891 Personal history of nicotine dependence: Secondary | ICD-10-CM | POA: Insufficient documentation

## 2016-04-30 DIAGNOSIS — E785 Hyperlipidemia, unspecified: Secondary | ICD-10-CM | POA: Diagnosis not present

## 2016-04-30 DIAGNOSIS — R0989 Other specified symptoms and signs involving the circulatory and respiratory systems: Secondary | ICD-10-CM | POA: Diagnosis present

## 2016-04-30 DIAGNOSIS — I739 Peripheral vascular disease, unspecified: Secondary | ICD-10-CM

## 2016-04-30 DIAGNOSIS — I10 Essential (primary) hypertension: Secondary | ICD-10-CM | POA: Insufficient documentation

## 2016-04-30 DIAGNOSIS — R938 Abnormal findings on diagnostic imaging of other specified body structures: Secondary | ICD-10-CM | POA: Diagnosis not present

## 2016-04-30 DIAGNOSIS — E1151 Type 2 diabetes mellitus with diabetic peripheral angiopathy without gangrene: Secondary | ICD-10-CM | POA: Diagnosis not present

## 2016-04-30 DIAGNOSIS — Z48812 Encounter for surgical aftercare following surgery on the circulatory system: Secondary | ICD-10-CM | POA: Insufficient documentation

## 2016-04-30 NOTE — Progress Notes (Signed)
[facility]   Patient name: Haley Dunn   MRN: [id number]        DOB: 1966/03/15        Sex: female   REASON FOR VISIT: F/U post left-to-right femoral-femoral bypass   HPI: Haley Dunn is a 50 y.o. female s/p left-to-right femoral-femoral bypass dacron graft June 2016 who presents to the office for follow-up. She still complains of pain in her right hip with radiation down her right leg. At her last office visit one year ago recommended she go back to see Dr. Lorin Mercy but she has not yet done this. States that nothing makes the pain worse and that it is constant. Her right foot has been particularly more sensitive and states that even water touching it causes an unpleasant sensation. Experiences cramping of her great right toe at night that is alleviated by pulling or pushing her toes forward/backward. Occasionally, she will experience a painful sensation in her right lower abdomen near the surgical site which is alleviated by pressure. She has quit smoking as of 03/24/16. Denies any chest pain, SOB, rest pain, claudication pain, and nonhealing wounds. At this time, she has no further complaints or concerns. She had recent cataract surgery without difficulty. She continues to take aspirin and statin.        Past Medical History  Diagnosis Date  . Acid reflux    . Anxiety    . Asthma    . Cataracts, bilateral    . Diabetes mellitus without complication ([place])    . Complication of anesthesia        pt stated she woke up to soon.  . Peripheral vascular disease ([place])             Family History  Problem Relation Age of Onset  . Cancer Mother    . Deep vein thrombosis Father        SOCIAL HISTORY:      Social History  Substance Use Topics  . Smoking status: Former Smoker -- 0.25 packs/day for 30 years      Types: Cigarettes      Quit date: 03/25/2015  . Smokeless tobacco: Never Used  . Alcohol Use: 2.4 - 3.6 oz/week      4-6 Cans of beer per week          Allergies  Allergen Reactions  . Codeine Itching  . Sulfa Antibiotics Rash            Current Outpatient Prescriptions  Medication Sig Dispense Refill  . ALPRAZolam (XANAX) 1 MG tablet Take 0.5 tablets by mouth 2 (two) times daily as needed for anxiety or sleep.    3  . aspirin 325 MG tablet Take 325 mg by mouth daily.      . diclofenac (VOLTAREN) 75 MG EC tablet Take 75 mg by mouth 2 (two) times daily as needed (pain).    5  . furosemide (LASIX) 40 MG tablet Take 1 tablet by mouth as needed for fluid or edema.    1  . glipiZIDE (GLUCOTROL) 10 MG tablet Take 10 mg by mouth 2 (two) times daily.   1  . medroxyPROGESTERone (DEPO-PROVERA) 150 MG/ML injection Inject 150 mg into the muscle every 3 (three) months.    4  . metFORMIN (GLUCOPHAGE) 500 MG tablet Take 1 tablet by mouth 2 (two) times daily.   5  . omeprazole (PRILOSEC) 20 MG capsule Take 20 mg by mouth daily as  needed (acid reflux).    12  . OSENI 25-15 MG TABS Take 1 tablet by mouth daily.   0  . pravastatin (PRAVACHOL) 20 MG tablet Take 20 mg by mouth daily.      [name] PROAIR HFA 108 (90 BASE) MCG/ACT inhaler Inhale 2 puffs into the lungs every 6 (six) hours as needed for wheezing or shortness of breath.    3  . tiZANidine (ZANAFLEX) 4 MG tablet Take 4 mg by mouth 2 (two) times daily as needed.    0  . oxyCODONE-acetaminophen (PERCOCET/ROXICET) 5-325 MG per tablet Take 1 tablet by mouth every 4 (four) hours as needed for moderate pain. (Patient not taking: Reported on 04/25/2015) 30 tablet 0  . simvastatin (ZOCOR) 10 MG tablet Take 1 tablet (10 mg total) by mouth daily. (Patient not taking: Reported on 04/25/2015) 30 tablet 3    No current facility-administered medications for this visit.      REVIEW OF SYSTEMS:  [X]  denotes positive finding, [ ]  denotes negative finding Cardiac   Comments:  Chest pain or chest pressure:      Shortness of breath upon exertion:      Short of breath when lying flat:      Irregular heart rhythm:              Vascular      Pain in calf, thigh, or hip brought on by ambulation: X Pin and needles sensation down right leg into right foot. Great right toe cramps at night  Pain in feet at night that wakes you up from your sleep:       Blood clot in your veins:      Leg swelling:              Pulmonary      Oxygen at home:      Productive cough:       Wheezing:              Neurologic      Sudden weakness in arms or legs:       Sudden numbness in arms or legs:       Sudden onset of difficulty speaking or slurred speech:      Temporary loss of vision in one eye:       Problems with dizziness:              Gastrointestinal      Blood in stool:       Vomited blood:              Genitourinary      Burning when urinating:       Blood in urine:             Psychiatric      Major depression:              Hematologic      Bleeding problems:      Problems with blood clotting too easily:             Skin      Rashes or ulcers:             Constitutional      Fever or chills:          PHYSICAL EXAM:  Vitals:   04/30/16 1537  BP: 134/83  Pulse: 89  Resp: 20  Temp: 98 F (36.7 C)  TempSrc: Oral  SpO2: 98%  Weight: 193 lb 8 oz (87.8  kg)  Height: 5\' 5"  (1.651 m)     GENERAL: The patient is a well-nourished female, in no acute distress. The vital signs are documented above. CARDIAC: There is a regular rate and rhythm, no carotid bruits VASCULAR: Femoral pulse 2+ bilaterally,Easily palpable graft pulse DP 2+ bilaterally, feet are warm and well-perfused with brisk capillary refill PULMONARY: There is good air exchange bilaterally without wheezing or rales. ABDOMEN: Soft and non-tender  MUSCULOSKELETAL: There are no major deformities or cyanosis. NEUROLOGIC: No focal weakness or paresthesias are detected. SKIN: There are no ulcers or rashes noted. PSYCHIATRIC: The patient has a normal affect.   DATA:  ABIs 0.7 bilaterally    MEDICAL ISSUES: S/P Left-to-Right  Femoral-Femoral bypass dacron graft. ABIs slightly decreased compared to her last office visit he year ago however she has palpable pulses in her feet so this may be spurious.      She will follow-up for continued surveillance of peripheral arterial disease in 12 months time. We will obtain ABIs at that point as well.  I encouraged her to follow up with Dr. Lorin Mercy regarding her right leg pain.   Ruta Hinds, MD [facility] Office: [phone] Pager: [phone]

## 2016-05-04 NOTE — Addendum Note (Signed)
Addended by: Lianne Cure A on: 05/04/2016 12:14 PM   Modules accepted: Orders

## 2016-05-08 ENCOUNTER — Encounter: Payer: Self-pay | Admitting: Internal Medicine

## 2016-05-12 ENCOUNTER — Encounter: Payer: Self-pay | Admitting: Cardiology

## 2016-06-04 ENCOUNTER — Ambulatory Visit (INDEPENDENT_AMBULATORY_CARE_PROVIDER_SITE_OTHER): Payer: BLUE CROSS/BLUE SHIELD

## 2016-06-04 ENCOUNTER — Ambulatory Visit (INDEPENDENT_AMBULATORY_CARE_PROVIDER_SITE_OTHER): Payer: BLUE CROSS/BLUE SHIELD | Admitting: Orthopaedic Surgery

## 2016-06-04 ENCOUNTER — Encounter (INDEPENDENT_AMBULATORY_CARE_PROVIDER_SITE_OTHER): Payer: Self-pay | Admitting: Orthopaedic Surgery

## 2016-06-04 VITALS — BP 130/77 | HR 89 | Ht 65.0 in | Wt 189.0 lb

## 2016-06-04 DIAGNOSIS — G8929 Other chronic pain: Secondary | ICD-10-CM | POA: Diagnosis not present

## 2016-06-04 DIAGNOSIS — M545 Low back pain, unspecified: Secondary | ICD-10-CM | POA: Insufficient documentation

## 2016-06-04 DIAGNOSIS — M65312 Trigger thumb, left thumb: Secondary | ICD-10-CM | POA: Insufficient documentation
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
## 2016-06-16 DIAGNOSIS — M65312 Trigger thumb, left thumb: Secondary | ICD-10-CM | POA: Diagnosis not present

## 2016-06-16 MED ORDER — METHYLPREDNISOLONE ACETATE 40 MG/ML IJ SUSP: 13.3300 mg | INTRAMUSCULAR | Status: DC | PRN

## 2016-06-16 MED ORDER — BUPIVACAINE HCL 0.25 % IJ SOLN: 0.3300 mL | INTRAMUSCULAR | Status: DC | PRN

## 2016-06-18 ENCOUNTER — Ambulatory Visit (INDEPENDENT_AMBULATORY_CARE_PROVIDER_SITE_OTHER): Payer: BLUE CROSS/BLUE SHIELD | Admitting: Orthopaedic Surgery

## 2016-06-18 ENCOUNTER — Encounter (INDEPENDENT_AMBULATORY_CARE_PROVIDER_SITE_OTHER): Payer: Self-pay | Admitting: Orthopaedic Surgery

## 2016-06-18 VITALS — Ht 65.0 in | Wt 189.0 lb

## 2016-06-18 DIAGNOSIS — G8929 Other chronic pain: Secondary | ICD-10-CM

## 2016-06-18 DIAGNOSIS — M545 Low back pain: Secondary | ICD-10-CM | POA: Diagnosis not present

## 2016-06-18 DIAGNOSIS — M65312 Trigger thumb, left thumb: Secondary | ICD-10-CM
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
## 2016-06-25 ENCOUNTER — Telehealth (INDEPENDENT_AMBULATORY_CARE_PROVIDER_SITE_OTHER): Payer: Self-pay | Admitting: Radiology

## 2016-06-25 NOTE — Telephone Encounter (Signed)
Email received from pre-op that patient had no directions on what to do about her aspirin prior to surgery. After speaking to Dr. Lorin Mercy, he states that she is having trigger finger release and aspirin will be fine. Patient does not need to discontinue. I left voicemail for patient on her mobile phone advising.

## 2016-07-02 ENCOUNTER — Telehealth (INDEPENDENT_AMBULATORY_CARE_PROVIDER_SITE_OTHER): Payer: Self-pay | Admitting: Orthopaedic Surgery

## 2016-07-02 NOTE — Telephone Encounter (Signed)
LVM with pt to call to reschedule her surgery. Will try again at a later time.

## 2016-07-09 ENCOUNTER — Inpatient Hospital Stay (INDEPENDENT_AMBULATORY_CARE_PROVIDER_SITE_OTHER): Payer: BLUE CROSS/BLUE SHIELD | Admitting: Orthopaedic Surgery

## 2016-07-23 ENCOUNTER — Inpatient Hospital Stay (INDEPENDENT_AMBULATORY_CARE_PROVIDER_SITE_OTHER): Payer: BLUE CROSS/BLUE SHIELD | Admitting: Orthopaedic Surgery

## 2017-05-06 ENCOUNTER — Ambulatory Visit (HOSPITAL_COMMUNITY)
Admission: RE | Admit: 2017-05-06 | Discharge: 2017-05-06 | Disposition: A | Discharge status: Home / Self Care | Payer: BLUE CROSS/BLUE SHIELD | Source: Ambulatory Visit | Attending: Vascular Surgery | Admitting: Vascular Surgery

## 2017-05-06 ENCOUNTER — Ambulatory Visit (INDEPENDENT_AMBULATORY_CARE_PROVIDER_SITE_OTHER): Payer: BLUE CROSS/BLUE SHIELD | Admitting: Vascular Surgery

## 2017-05-06 ENCOUNTER — Encounter: Payer: Self-pay | Admitting: Vascular Surgery

## 2017-05-06 VITALS — BP 151/86 | HR 91 | Temp 98.5°F | Resp 16 | Ht 65.0 in | Wt 194.0 lb

## 2017-05-06 DIAGNOSIS — I739 Peripheral vascular disease, unspecified: Secondary | ICD-10-CM
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
## 2017-05-20 ENCOUNTER — Telehealth (INDEPENDENT_AMBULATORY_CARE_PROVIDER_SITE_OTHER): Payer: Self-pay | Admitting: Orthopaedic Surgery

## 2017-05-20 NOTE — Telephone Encounter (Signed)
Patient called wanting to get copy of records. I explained that she needs to sign release form and she stated she would go by the [facility] office today and fill out and have them to fax it to me

## 2017-08-05 ENCOUNTER — Telehealth: Payer: Self-pay | Admitting: Vascular Surgery

## 2017-08-05 NOTE — Telephone Encounter (Signed)
Spoke with Dr Glenna Fellows today regarding pt.  She complains primarily of right buttock and posterior thigh pain with some right groin pain.  He is considering L45 fusion and wants to make sure this doesn't have a vascular component first.  I will have our office set up an appt for her with CTA abd pelvis with runoff prior within the next few weeks.  Ruta Hinds, MD [facility] [place] Office: [phone] Pager: [phone]

## 2017-08-06 ENCOUNTER — Other Ambulatory Visit: Payer: Self-pay

## 2017-08-06 ENCOUNTER — Telehealth: Payer: Self-pay | Admitting: Vascular Surgery

## 2017-08-06 DIAGNOSIS — I739 Peripheral vascular disease, unspecified: Secondary | ICD-10-CM

## 2017-08-06 NOTE — Telephone Encounter (Signed)
-----   Message from Elam Dutch, MD sent at 08/05/2017  4:21 PM EDT ----- Can you have someone set up an appt for Haley Dunn in 2-3 weeks with CTA abdomen pelvis with runoff prior to the appt  Indication is buttock claudication  Thanks  Juanda Crumble

## 2017-08-06 NOTE — Telephone Encounter (Signed)
-----   Message from Elam Dutch, MD sent at 08/06/2017 12:45 PM EDT ----- Regarding: RE: Appointment Question Contact: [phone] Yes she will need both ----- Message ----- From: Lujean Amel Sent: 08/06/2017  12:28 PM To: Elam Dutch, MD Subject: Appointment Question                           Dr.Fields, This patient called today regarding the CTA you ordered for her. She states Dr.Roy has ordered a MRI of her back for next week. She questioned if she would still need the CTA as well. I assume she will but I told her I would double check with you to be sure. Please advise.  Thanks, Anne Ng

## 2017-08-06 NOTE — Telephone Encounter (Signed)
Left message for pt regarding her question for both CTA and MRA. awt

## 2017-08-06 NOTE — Telephone Encounter (Signed)
Per Dr. Oneida Alar' instructions I scheduled an appt for this patient to have a CTA abd/pelvis on 09/02/17 at 11:30am at [address] [facility]. She is to arrive at 11am w/ no solid foods 4 hours prior. Liquid and medications are okay.  She is to see Dr.Fields at our office at 3pm for follow up. I left a detailed message for her on her VM regarding this and also mailed a letter w/ the above instructions. awt

## 2017-09-02 ENCOUNTER — Encounter: Payer: Self-pay | Admitting: *Deleted

## 2017-09-02 ENCOUNTER — Other Ambulatory Visit: Payer: Self-pay | Admitting: *Deleted

## 2017-09-02 ENCOUNTER — Encounter: Payer: Self-pay | Admitting: Vascular Surgery

## 2017-09-02 ENCOUNTER — Ambulatory Visit (INDEPENDENT_AMBULATORY_CARE_PROVIDER_SITE_OTHER): Payer: BLUE CROSS/BLUE SHIELD | Admitting: Vascular Surgery

## 2017-09-02 ENCOUNTER — Other Ambulatory Visit: Payer: Self-pay

## 2017-09-02 ENCOUNTER — Ambulatory Visit
Admission: RE | Admit: 2017-09-02 | Discharge: 2017-09-02 | Disposition: A | Discharge status: Home / Self Care | Payer: BLUE CROSS/BLUE SHIELD | Source: Ambulatory Visit | Attending: Vascular Surgery | Admitting: Vascular Surgery

## 2017-09-02 VITALS — BP 128/75 | HR 101 | Resp 18 | Ht 65.0 in | Wt 191.0 lb

## 2017-09-02 DIAGNOSIS — I739 Peripheral vascular disease, unspecified: Secondary | ICD-10-CM

## 2017-09-02 MED ORDER — IOPAMIDOL (ISOVUE-370) INJECTION 76%
125.0000 mL | Freq: Once | INTRAVENOUS | Status: AC | PRN
  Administered 2017-09-02: 125 mL via INTRAVENOUS
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
## 2017-09-03 ENCOUNTER — Ambulatory Visit (HOSPITAL_COMMUNITY)
Admission: RE | Admit: 2017-09-03 | Discharge: 2017-09-03 | Disposition: A | Discharge status: Home / Self Care | Payer: BLUE CROSS/BLUE SHIELD | Source: Ambulatory Visit | Attending: Vascular Surgery | Admitting: Vascular Surgery

## 2017-09-03 ENCOUNTER — Encounter (HOSPITAL_COMMUNITY)
Admission: RE | Disposition: A | Discharge status: Home / Self Care | Payer: Self-pay | Source: Ambulatory Visit | Attending: Vascular Surgery

## 2017-09-03 ENCOUNTER — Telehealth: Payer: Self-pay | Admitting: Vascular Surgery

## 2017-09-03 DIAGNOSIS — I70218 Atherosclerosis of native arteries of extremities with intermittent claudication, other extremity: Secondary | ICD-10-CM | POA: Insufficient documentation

## 2017-09-03 DIAGNOSIS — Z809 Family history of malignant neoplasm, unspecified: Secondary | ICD-10-CM | POA: Diagnosis not present

## 2017-09-03 DIAGNOSIS — Z882 Allergy status to sulfonamides status: Secondary | ICD-10-CM | POA: Diagnosis not present

## 2017-09-03 DIAGNOSIS — Z832 Family history of diseases of the blood and blood-forming organs and certain disorders involving the immune mechanism: Secondary | ICD-10-CM | POA: Diagnosis not present

## 2017-09-03 DIAGNOSIS — Z7982 Long term (current) use of aspirin: Secondary | ICD-10-CM | POA: Diagnosis not present

## 2017-09-03 DIAGNOSIS — Z9841 Cataract extraction status, right eye: Secondary | ICD-10-CM | POA: Diagnosis not present

## 2017-09-03 DIAGNOSIS — Z79899 Other long term (current) drug therapy: Secondary | ICD-10-CM | POA: Insufficient documentation

## 2017-09-03 DIAGNOSIS — E1151 Type 2 diabetes mellitus with diabetic peripheral angiopathy without gangrene: Secondary | ICD-10-CM | POA: Insufficient documentation

## 2017-09-03 DIAGNOSIS — Z9889 Other specified postprocedural states: Secondary | ICD-10-CM | POA: Insufficient documentation

## 2017-09-03 DIAGNOSIS — Z91040 Latex allergy status: Secondary | ICD-10-CM | POA: Insufficient documentation

## 2017-09-03 DIAGNOSIS — J45909 Unspecified asthma, uncomplicated: Secondary | ICD-10-CM | POA: Insufficient documentation

## 2017-09-03 DIAGNOSIS — Z9842 Cataract extraction status, left eye: Secondary | ICD-10-CM | POA: Diagnosis not present

## 2017-09-03 DIAGNOSIS — I10 Essential (primary) hypertension: Secondary | ICD-10-CM | POA: Insufficient documentation

## 2017-09-03 DIAGNOSIS — Z7984 Long term (current) use of oral hypoglycemic drugs: Secondary | ICD-10-CM | POA: Insufficient documentation

## 2017-09-03 DIAGNOSIS — K219 Gastro-esophageal reflux disease without esophagitis: Secondary | ICD-10-CM | POA: Diagnosis not present

## 2017-09-03 DIAGNOSIS — Z951 Presence of aortocoronary bypass graft: Secondary | ICD-10-CM | POA: Insufficient documentation

## 2017-09-03 DIAGNOSIS — F419 Anxiety disorder, unspecified: Secondary | ICD-10-CM | POA: Insufficient documentation

## 2017-09-03 DIAGNOSIS — H269 Unspecified cataract: Secondary | ICD-10-CM | POA: Insufficient documentation

## 2017-09-03 DIAGNOSIS — Z87891 Personal history of nicotine dependence: Secondary | ICD-10-CM | POA: Insufficient documentation

## 2017-09-03 DIAGNOSIS — I70213 Atherosclerosis of native arteries of extremities with intermittent claudication, bilateral legs: Secondary | ICD-10-CM

## 2017-09-03 DIAGNOSIS — Z885 Allergy status to narcotic agent status: Secondary | ICD-10-CM | POA: Diagnosis not present

## 2017-09-03 HISTORY — PX: PERIPHERAL VASCULAR INTERVENTION: CATH118257

## 2017-09-03 HISTORY — PX: ABDOMINAL AORTOGRAM W/LOWER EXTREMITY: CATH118223

## 2017-09-03 LAB — GLUCOSE, CAPILLARY: GLUCOSE-CAPILLARY: 156 mg/dL — AB (ref 65–99)

## 2017-09-03 LAB — POCT I-STAT, CHEM 8
BUN: 9 mg/dL (ref 6–20)
Calcium, Ion: 1.21 mmol/L (ref 1.15–1.40)
Chloride: 106 mmol/L (ref 101–111)
Creatinine, Ser: 0.4 mg/dL — ABNORMAL LOW (ref 0.44–1.00)
Glucose, Bld: 213 mg/dL — ABNORMAL HIGH (ref 65–99)
HEMATOCRIT: 35 % — AB (ref 36.0–46.0)
HEMOGLOBIN: 11.9 g/dL — AB (ref 12.0–15.0)
POTASSIUM: 4 mmol/L (ref 3.5–5.1)
SODIUM: 139 mmol/L (ref 135–145)
TCO2: 22 mmol/L (ref 22–32)

## 2017-09-03 LAB — POCT ACTIVATED CLOTTING TIME
ACTIVATED CLOTTING TIME: 158 s
Activated Clotting Time: 241 seconds

## 2017-09-03 LAB — HCG, SERUM, QUALITATIVE: Preg, Serum: NEGATIVE

## 2017-09-03 SURGERY — ABDOMINAL AORTOGRAM W/LOWER EXTREMITY
Anesthesia: LOCAL

## 2017-09-03 MED ORDER — MORPHINE SULFATE (PF) 10 MG/ML IV SOLN: 2.0000 mg | INTRAVENOUS | Status: DC | PRN

## 2017-09-03 MED ORDER — HEPARIN (PORCINE) IN NACL 1000-0.9 UT/500ML-% IV SOLN
INTRAVENOUS | Status: AC
  Filled 2017-09-03: qty 1000

## 2017-09-03 MED ORDER — HYDRALAZINE HCL 20 MG/ML IJ SOLN: 5.0000 mg | INTRAMUSCULAR | Status: DC | PRN

## 2017-09-03 MED ORDER — IODIXANOL 320 MG/ML IV SOLN
INTRAVENOUS | Status: DC | PRN
  Administered 2017-09-03: 70 mL via INTRAVENOUS

## 2017-09-03 MED ORDER — HEPARIN SODIUM (PORCINE) 1000 UNIT/ML IJ SOLN
INTRAMUSCULAR | Status: DC | PRN
  Administered 2017-09-03: 8000 [IU] via INTRAVENOUS

## 2017-09-03 MED ORDER — LABETALOL HCL 5 MG/ML IV SOLN
10.0000 mg | INTRAVENOUS | Status: DC | PRN
  Administered 2017-09-03: 10 mg via INTRAVENOUS

## 2017-09-03 MED ORDER — ACETAMINOPHEN 325 MG PO TABS: 650.0000 mg | ORAL_TABLET | ORAL | Status: DC | PRN

## 2017-09-03 MED ORDER — SODIUM CHLORIDE 0.9 % IV SOLN: 250.0000 mL | INTRAVENOUS | Status: DC | PRN

## 2017-09-03 MED ORDER — SODIUM CHLORIDE 0.9% FLUSH: 3.0000 mL | Freq: Two times a day (BID) | INTRAVENOUS | Status: DC

## 2017-09-03 MED ORDER — ONDANSETRON HCL 4 MG/2ML IJ SOLN: 4.0000 mg | Freq: Four times a day (QID) | INTRAMUSCULAR | Status: DC | PRN

## 2017-09-03 MED ORDER — LIDOCAINE HCL (PF) 1 % IJ SOLN
INTRAMUSCULAR | Status: AC
  Filled 2017-09-03: qty 30

## 2017-09-03 MED ORDER — HEPARIN SODIUM (PORCINE) 1000 UNIT/ML IJ SOLN
INTRAMUSCULAR | Status: AC
  Filled 2017-09-03: qty 1

## 2017-09-03 MED ORDER — CLOPIDOGREL BISULFATE 75 MG PO TABS: 75.0000 mg | ORAL_TABLET | Freq: Every day | ORAL | 11 refills | Status: DC

## 2017-09-03 MED ORDER — CLOPIDOGREL BISULFATE 300 MG PO TABS
300.0000 mg | ORAL_TABLET | Freq: Once | ORAL | Status: AC
  Administered 2017-09-03: 300 mg via ORAL

## 2017-09-03 MED ORDER — SODIUM CHLORIDE 0.9 % IV SOLN
INTRAVENOUS | Status: DC
  Administered 2017-09-03: 09:00:00 via INTRAVENOUS

## 2017-09-03 MED ORDER — LABETALOL HCL 5 MG/ML IV SOLN
INTRAVENOUS | Status: AC
  Filled 2017-09-03: qty 4

## 2017-09-03 MED ORDER — LIDOCAINE HCL (PF) 1 % IJ SOLN
INTRAMUSCULAR | Status: DC | PRN
  Administered 2017-09-03: 15 mL via INTRADERMAL

## 2017-09-03 MED ORDER — SODIUM CHLORIDE 0.9 % IV SOLN: INTRAVENOUS | Status: DC

## 2017-09-03 MED ORDER — CLOPIDOGREL BISULFATE 300 MG PO TABS
ORAL_TABLET | ORAL | Status: AC
  Filled 2017-09-03: qty 1

## 2017-09-03 MED ORDER — OXYCODONE HCL 5 MG PO TABS
ORAL_TABLET | ORAL | Status: AC
  Filled 2017-09-03: qty 1

## 2017-09-03 MED ORDER — SODIUM CHLORIDE 0.9% FLUSH: 3.0000 mL | INTRAVENOUS | Status: DC | PRN

## 2017-09-03 MED ORDER — OXYCODONE HCL 5 MG PO TABS
5.0000 mg | ORAL_TABLET | ORAL | Status: DC | PRN
  Administered 2017-09-03: 5 mg via ORAL

## 2017-09-03 MED ORDER — HEPARIN (PORCINE) IN NACL 2-0.9 UNITS/ML
INTRAMUSCULAR | Status: AC | PRN
  Administered 2017-09-03 (×2): 500 mL

## 2017-09-03 SURGICAL SUPPLY — 15 items
CATH ANGIO 5F PIGTAIL 65CM (CATHETERS) ×1 IMPLANT
CATH SOFT-VU 4F 65 STRAIGHT (CATHETERS) IMPLANT
CATH SOFT-VU STRAIGHT 4F 65CM (CATHETERS) ×3
COVER PRB 48X5XTLSCP FOLD TPE (BAG) IMPLANT
COVER PROBE 5X48 (BAG) ×3
KIT PV (KITS) ×3 IMPLANT
SHEATH AVANTI 11CM 6FR (SHEATH) ×1 IMPLANT
SHEATH BRITE TIP 7FR 35CM (SHEATH) ×1 IMPLANT
SHEATH PINNACLE 5F 10CM (SHEATH) ×1 IMPLANT
STENT VIABAHN 7X29X80 VBX (Permanent Stent) ×1 IMPLANT
SYR MEDRAD MARK V 150ML (SYRINGE) ×3 IMPLANT
TRANSDUCER W/STOPCOCK (MISCELLANEOUS) ×3 IMPLANT
TRAY PV CATH (CUSTOM PROCEDURE TRAY) ×3 IMPLANT
WIRE AMPLATZ SS-J .035X180CM (WIRE) ×1 IMPLANT
WIRE BENTSON .035X145CM (WIRE) ×1 IMPLANT

## 2017-09-03 NOTE — Progress Notes (Signed)
Site area: Left groin a 7 french arterial  Sheath was removed  Site Prior to Removal:  Level 0  Pressure Applied For 20 MINUTES    Bedrest Beginning at 1345p  Manual:   Yes.    Patient Status During Pull:  stable  Post Pull Groin Site:  Level 0  Post Pull Instructions Given:  Yes.    Post Pull Pulses Present:  Yes.    Dressing Applied:  Yes.    Comments:  VS remain stable

## 2017-09-03 NOTE — Discharge Instructions (Signed)

## 2017-09-03 NOTE — Telephone Encounter (Signed)
sch appt lvm 12/03/17 3pm ABI 345pm f/u NP s/p Abd aortogram, pelvic angiogram L common iliac Stent per stf msg

## 2017-09-03 NOTE — Op Note (Addendum)
Procedure: Abdominal aortogram, pelvic angiogram, left common iliac stent (7 x 39 VBX), ultrasound-guided cannulation of left common femoral artery  Preoperative diagnosis: Buttock claudication  Postoperative diagnosis: Same  Anesthesia: Local  Operative findings: #1  80% stenosis proximal left common iliac artery stented to 0% residual stenosis  #2 patent femoral-femoral bypass  Operative details: After obtaining informed consent, the patient was taken to the [place] lab.  The patient was placed in supine position Angio table.  Both groins were prepped and draped in usual sterile fashion.  Local anesthesia was inflated over the left common femoral artery.  The femoral femoral bypass was identified with ultrasound. Permanent recording was placed in the chart. I was able to identify a window in the common femoral artery just above the femoral femoral bypass for cannulation.  This was fairly tedious due to the patient's pannus and prior scar tissue.  I was able to successfully cannulate this area and an 035 versacore wire threaded up in the abdominal aorta under fluoroscopic guidance.  However, on trying to place a 5 [place] sheath over the guidewire due to dense scar tissue I was unable to advance the sheath over the wire.  Therefore a 4 French straight catheter was advanced over the wire to the abdominal aorta and the versacore wire was swapped out for an 035 Amplatz wire.  I was then able to advance the 5 French sheath over this into the left common femoral artery.  A 5 French pig tail catheter was advanced over the guidewire in the abdominal aorta and an abdominal aortogram obtained in [facility] projection.  This shows a left and right renal arteries to be patent.  The infrarenal abdominal aorta has moderate atherosclerotic change but no flow-limiting stenosis greater than 20%.  The [facility] film suggested some narrowing of the proximal left common iliac artery.  We pulled the pigtail catheter down just above the aortic  bifurcation and obtain bilateral oblique views of the pelvis.  This showed that the lesion was at least 80%.  It extended from the origin of the left common iliac artery to a few centimeters into the iliac artery.  The left internal iliac artery is patent.  There is a femoral-femoral bypass extending from the left common femoral to the right common femoral artery.  This is patent.  Also on the oblique film it was noted that the cannulation site was above the level of the femoral femoral bypass by about 1 cm right at the top of the femoral head.  At this point it was decided to intervene on the iliac stenosis.  The pigtail catheter was straightened over an 035 Amplatz wire.  The 5 French sheath was removed and exchanged for a 7 French bright tip sheath.  The patient was given 8000 units of heparin.  A 7 x 39 VBX balloon expandable stent was brought up in the operative field and advanced and centered on the lesion.  This was then deployed to 11 atm for 1 minute.  The patient experienced some pressure during the appointment.  The balloon was then deflated and a completion angiogram showed good wall apposition of the stent which was fully expanded with 0% residual stenosis.  At this point the sheath was pulled back down in the left hemipelvis to be removed after the ACT is less than 175.  The Amplatz wire was removed.  Ruta Hinds, MD [facility] [place] Office: [phone] Pager: [phone]

## 2017-09-03 NOTE — Interval H&P Note (Signed)
History and Physical Interval Note:  09/03/2017 8:42 AM  Haley Dunn  has presented today for surgery, with the diagnosis of claudication  The various methods of treatment have been discussed with the patient and family. After consideration of risks, benefits and other options for treatment, the patient has consented to  Procedure(s): ABDOMINAL AORTOGRAM W/LOWER EXTREMITY (N/A) as a surgical intervention .  The patient's history has been reviewed, patient examined, no change in status, stable for surgery.  I have reviewed the patient's chart and labs.  Questions were answered to the patient's satisfaction.     Ruta Hinds

## 2017-09-06 ENCOUNTER — Encounter (HOSPITAL_COMMUNITY): Payer: Self-pay | Admitting: Vascular Surgery

## 2017-09-06 ENCOUNTER — Other Ambulatory Visit: Payer: Self-pay

## 2017-09-06 DIAGNOSIS — Z48812 Encounter for surgical aftercare following surgery on the circulatory system: Secondary | ICD-10-CM

## 2017-09-06 DIAGNOSIS — I739 Peripheral vascular disease, unspecified: Secondary | ICD-10-CM

## 2017-09-08 MED FILL — Heparin Sod (Porcine)-NaCl IV Soln 1000 Unit/500ML-0.9%: INTRAVENOUS | Qty: 1000 | Status: AC

## 2017-12-03 ENCOUNTER — Encounter (HOSPITAL_COMMUNITY): Payer: BLUE CROSS/BLUE SHIELD

## 2017-12-03 ENCOUNTER — Ambulatory Visit: Payer: BLUE CROSS/BLUE SHIELD | Admitting: Family

## 2017-12-10 ENCOUNTER — Ambulatory Visit: Payer: BLUE CROSS/BLUE SHIELD | Admitting: Family

## 2017-12-10 ENCOUNTER — Encounter (HOSPITAL_COMMUNITY): Payer: BLUE CROSS/BLUE SHIELD

## 2017-12-30 ENCOUNTER — Ambulatory Visit: Payer: BLUE CROSS/BLUE SHIELD | Admitting: Family

## 2017-12-30 ENCOUNTER — Encounter (HOSPITAL_COMMUNITY): Payer: BLUE CROSS/BLUE SHIELD

## 2018-01-10 ENCOUNTER — Other Ambulatory Visit: Payer: Self-pay

## 2018-01-10 ENCOUNTER — Encounter: Payer: Self-pay | Admitting: Family

## 2018-01-10 ENCOUNTER — Ambulatory Visit (INDEPENDENT_AMBULATORY_CARE_PROVIDER_SITE_OTHER): Payer: BLUE CROSS/BLUE SHIELD | Admitting: Family

## 2018-01-10 ENCOUNTER — Ambulatory Visit (HOSPITAL_COMMUNITY)
Admission: RE | Admit: 2018-01-10 | Discharge: 2018-01-10 | Disposition: A | Discharge status: Home / Self Care | Payer: BLUE CROSS/BLUE SHIELD | Source: Ambulatory Visit | Attending: Vascular Surgery | Admitting: Vascular Surgery

## 2018-01-10 VITALS — BP 122/80 | HR 98 | Temp 98.1°F | Resp 16 | Ht 64.0 in | Wt 182.0 lb

## 2018-01-10 DIAGNOSIS — Z95828 Presence of other vascular implants and grafts: Secondary | ICD-10-CM | POA: Diagnosis not present

## 2018-01-10 DIAGNOSIS — I771 Stricture of artery: Secondary | ICD-10-CM | POA: Diagnosis not present

## 2018-01-10 DIAGNOSIS — Z48812 Encounter for surgical aftercare following surgery on the circulatory system: Secondary | ICD-10-CM | POA: Diagnosis not present

## 2018-01-10 DIAGNOSIS — I739 Peripheral vascular disease, unspecified: Secondary | ICD-10-CM | POA: Insufficient documentation

## 2018-01-10 DIAGNOSIS — Z87891 Personal history of nicotine dependence: Secondary | ICD-10-CM | POA: Diagnosis not present

## 2018-01-10 DIAGNOSIS — I779 Disorder of arteries and arterioles, unspecified: Secondary | ICD-10-CM | POA: Diagnosis not present

## 2018-01-10 NOTE — Patient Instructions (Signed)
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
## 2018-02-01 ENCOUNTER — Other Ambulatory Visit: Payer: Self-pay

## 2018-02-01 DIAGNOSIS — I779 Disorder of arteries and arterioles, unspecified: Secondary | ICD-10-CM

## 2018-05-12 ENCOUNTER — Ambulatory Visit (INDEPENDENT_AMBULATORY_CARE_PROVIDER_SITE_OTHER): Payer: BLUE CROSS/BLUE SHIELD | Admitting: Vascular Surgery

## 2018-05-12 ENCOUNTER — Encounter: Payer: Self-pay | Admitting: Vascular Surgery

## 2018-05-12 ENCOUNTER — Other Ambulatory Visit: Payer: Self-pay

## 2018-05-12 ENCOUNTER — Ambulatory Visit (HOSPITAL_COMMUNITY)
Admission: RE | Admit: 2018-05-12 | Discharge: 2018-05-12 | Disposition: A | Discharge status: Home / Self Care | Payer: BLUE CROSS/BLUE SHIELD | Source: Ambulatory Visit | Attending: Vascular Surgery | Admitting: Vascular Surgery

## 2018-05-12 VITALS — BP 112/68 | HR 94 | Temp 97.3°F | Resp 20 | Ht 64.0 in | Wt 182.0 lb

## 2018-05-12 DIAGNOSIS — I779 Disorder of arteries and arterioles, unspecified: Secondary | ICD-10-CM | POA: Diagnosis not present

## 2018-05-12 DIAGNOSIS — I739 Peripheral vascular disease, unspecified: Secondary | ICD-10-CM
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
## 2018-08-16 ENCOUNTER — Other Ambulatory Visit: Payer: Self-pay | Admitting: Vascular Surgery

## 2018-08-24 DIAGNOSIS — Z0279 Encounter for issue of other medical certificate: Secondary | ICD-10-CM | POA: Diagnosis not present

## 2019-06-14 ENCOUNTER — Other Ambulatory Visit: Payer: Self-pay

## 2019-06-14 DIAGNOSIS — I739 Peripheral vascular disease, unspecified: Secondary | ICD-10-CM

## 2019-06-15 ENCOUNTER — Other Ambulatory Visit: Payer: Self-pay

## 2019-06-15 ENCOUNTER — Ambulatory Visit (INDEPENDENT_AMBULATORY_CARE_PROVIDER_SITE_OTHER): Payer: BC Managed Care – PPO | Admitting: Vascular Surgery

## 2019-06-15 ENCOUNTER — Encounter: Payer: Self-pay | Admitting: Vascular Surgery

## 2019-06-15 ENCOUNTER — Ambulatory Visit (HOSPITAL_COMMUNITY)
Admission: RE | Admit: 2019-06-15 | Discharge: 2019-06-15 | Disposition: A | Discharge status: Home / Self Care | Payer: BC Managed Care – PPO | Source: Ambulatory Visit | Attending: Vascular Surgery | Admitting: Vascular Surgery

## 2019-06-15 VITALS — BP 136/79 | HR 81 | Temp 97.8°F | Resp 20 | Ht 64.0 in | Wt 170.0 lb

## 2019-06-15 DIAGNOSIS — I739 Peripheral vascular disease, unspecified: Secondary | ICD-10-CM
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
## 2019-06-20 ENCOUNTER — Other Ambulatory Visit: Payer: Self-pay | Admitting: *Deleted

## 2019-06-20 DIAGNOSIS — I739 Peripheral vascular disease, unspecified: Secondary | ICD-10-CM

## 2019-08-04 ENCOUNTER — Other Ambulatory Visit: Payer: Self-pay | Admitting: Vascular Surgery

## 2019-08-31 ENCOUNTER — Encounter: Payer: Self-pay | Admitting: *Deleted

## 2019-09-05 ENCOUNTER — Other Ambulatory Visit: Payer: Self-pay

## 2019-09-05 ENCOUNTER — Telehealth: Payer: Self-pay | Admitting: Neurology

## 2019-09-05 ENCOUNTER — Ambulatory Visit: Payer: BC Managed Care – PPO | Admitting: Neurology

## 2019-09-05 ENCOUNTER — Encounter: Payer: Self-pay | Admitting: Neurology

## 2019-09-05 VITALS — BP 149/88 | HR 106 | Ht 64.0 in | Wt 169.0 lb

## 2019-09-05 DIAGNOSIS — R29898 Other symptoms and signs involving the musculoskeletal system: Secondary | ICD-10-CM | POA: Diagnosis not present

## 2019-09-05 DIAGNOSIS — R200 Anesthesia of skin: Secondary | ICD-10-CM | POA: Insufficient documentation
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
## 2019-09-05 NOTE — Telephone Encounter (Signed)
Patient wants MRI to be done at [facility]

## 2019-09-11 NOTE — Telephone Encounter (Signed)
Pt called office wondering what the status was for her MRI at [facility]. She is asking for a call back with an update.

## 2019-09-11 NOTE — Telephone Encounter (Signed)
[employer] Auth: [id number] (exp. 09/11/19 to 12/09/19) patient is scheduled at [facility] for 09/28/19 arrival time is 4:30 pm. Patient is aware of time and day. I also gave her their number of [phone] incase she needed to r/s.

## 2019-09-11 NOTE — Telephone Encounter (Signed)
Pt has called asking for a call back from [place].

## 2019-09-11 NOTE — Telephone Encounter (Signed)
I I spoke to the patient and she just wanted to verify her appt.

## 2019-09-28 ENCOUNTER — Ambulatory Visit (HOSPITAL_COMMUNITY)
Admission: RE | Admit: 2019-09-28 | Discharge: 2019-09-28 | Disposition: A | Discharge status: Home / Self Care | Payer: BC Managed Care – PPO | Source: Ambulatory Visit | Attending: Neurology | Admitting: Neurology

## 2019-09-28 ENCOUNTER — Emergency Department (HOSPITAL_COMMUNITY): Payer: BC Managed Care – PPO

## 2019-09-28 ENCOUNTER — Encounter (HOSPITAL_COMMUNITY): Payer: Self-pay | Admitting: *Deleted

## 2019-09-28 ENCOUNTER — Emergency Department (HOSPITAL_COMMUNITY)
Admission: EM | Admit: 2019-09-28 | Discharge: 2019-09-28 | Disposition: A | Discharge status: Home / Self Care | Payer: BC Managed Care – PPO | Attending: Emergency Medicine | Admitting: Emergency Medicine

## 2019-09-28 ENCOUNTER — Other Ambulatory Visit: Payer: Self-pay

## 2019-09-28 DIAGNOSIS — Z7982 Long term (current) use of aspirin: Secondary | ICD-10-CM | POA: Diagnosis not present

## 2019-09-28 DIAGNOSIS — Z7984 Long term (current) use of oral hypoglycemic drugs: Secondary | ICD-10-CM | POA: Diagnosis not present

## 2019-09-28 DIAGNOSIS — J45909 Unspecified asthma, uncomplicated: Secondary | ICD-10-CM | POA: Insufficient documentation

## 2019-09-28 DIAGNOSIS — I10 Essential (primary) hypertension: Secondary | ICD-10-CM | POA: Diagnosis not present

## 2019-09-28 DIAGNOSIS — Z7901 Long term (current) use of anticoagulants: Secondary | ICD-10-CM | POA: Insufficient documentation

## 2019-09-28 DIAGNOSIS — R200 Anesthesia of skin: Secondary | ICD-10-CM | POA: Insufficient documentation

## 2019-09-28 DIAGNOSIS — G5600 Carpal tunnel syndrome, unspecified upper limb: Secondary | ICD-10-CM | POA: Insufficient documentation

## 2019-09-28 DIAGNOSIS — Z87891 Personal history of nicotine dependence: Secondary | ICD-10-CM | POA: Insufficient documentation

## 2019-09-28 DIAGNOSIS — Z91040 Latex allergy status: Secondary | ICD-10-CM | POA: Insufficient documentation

## 2019-09-28 DIAGNOSIS — E119 Type 2 diabetes mellitus without complications: Secondary | ICD-10-CM | POA: Diagnosis not present

## 2019-09-28 DIAGNOSIS — R29898 Other symptoms and signs involving the musculoskeletal system: Secondary | ICD-10-CM

## 2019-09-28 DIAGNOSIS — G9520 Unspecified cord compression: Secondary | ICD-10-CM

## 2019-09-28 DIAGNOSIS — M4712 Other spondylosis with myelopathy, cervical region: Secondary | ICD-10-CM | POA: Diagnosis not present

## 2019-09-28 LAB — CBC
HCT: 38.3 % (ref 36.0–46.0)
Hemoglobin: 12.6 g/dL (ref 12.0–15.0)
MCH: 34.4 pg — ABNORMAL HIGH (ref 26.0–34.0)
MCHC: 32.9 g/dL (ref 30.0–36.0)
MCV: 104.6 fL — ABNORMAL HIGH (ref 80.0–100.0)
Platelets: 286 10*3/uL (ref 150–400)
RBC: 3.66 MIL/uL — ABNORMAL LOW (ref 3.87–5.11)
RDW: 13.2 % (ref 11.5–15.5)
WBC: 8.9 10*3/uL (ref 4.0–10.5)
nRBC: 0 % (ref 0.0–0.2)

## 2019-09-28 LAB — PROTIME-INR
INR: 1 (ref 0.8–1.2)
Prothrombin Time: 12.7 seconds (ref 11.4–15.2)

## 2019-09-28 LAB — BASIC METABOLIC PANEL
Anion gap: 10 (ref 5–15)
BUN: 11 mg/dL (ref 6–20)
CO2: 22 mmol/L (ref 22–32)
Calcium: 9.8 mg/dL (ref 8.9–10.3)
Chloride: 106 mmol/L (ref 98–111)
Creatinine, Ser: 0.42 mg/dL — ABNORMAL LOW (ref 0.44–1.00)
GFR calc Af Amer: >60 mL/min (ref >60)
GFR calc non Af Amer: >60 mL/min (ref >60)
Glucose, Bld: 129 mg/dL — ABNORMAL HIGH (ref 70–99)
Potassium: 4.1 mmol/L (ref 3.5–5.1)
Sodium: 138 mmol/L (ref 135–145)
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
## 2019-09-28 NOTE — Discharge Instructions (Signed)
I spoke with Dr. Marcello Moores, the neurosurgeon, he believes that you will need to have surgery to fix this before it becomes more serious.  Until then please use the medications that you are already taking, stop Plavix immediately, continue aspirin.  You will need to be seen on Monday, call tomorrow to make this appointment.  If you develop increasing pain weakness or worsening symptoms come back to the emergency department immediately.  He has asked that we did the CT scan and blood work tonight in preparation for the needed surgery.

## 2019-09-28 NOTE — ED Triage Notes (Signed)
States she has had numbness in her arms and hand for the past 3 months. Today she was sent from MRI for evaluation after her scan

## 2019-10-02 ENCOUNTER — Telehealth: Payer: Self-pay | Admitting: Neurology

## 2019-10-02 NOTE — Telephone Encounter (Signed)
IMPRESSION: 1. Severe multilevel disc and facet degeneration throughout the cervical spine. No acute bony abnormality. 2. Moderate spinal stenosis at C3-4 with cord hyperintensity compatible with compressive myelopathy. Severe left foraminal encroachment at C3-4 due to spurring 3. Small central disc protrusions at C4-5 and C5-6 4. Central disc protrusion and spurring at C6-7 with moderate spinal stenosis and moderate foraminal stenosis bilaterally. 5. These results were called by telephone at the time of interpretation on 09/28/2019 at 8:21 pm to provider Noemi Chapel, who verbally acknowledged these results.   I have called patient about MRI cervical report, evidence of severe spinal stenosis at C3-4, with evidence of cord signal changes,  She was sent to emergency room on September 28, 2019 from MRI, was giving a cervical collar, is on scheduled to see neurosurgeon on October 03, 2019

## 2019-10-03 ENCOUNTER — Other Ambulatory Visit: Payer: Self-pay | Admitting: Neurosurgery

## 2019-10-04 ENCOUNTER — Encounter: Payer: BC Managed Care – PPO | Admitting: Neurology

## 2019-10-04 ENCOUNTER — Other Ambulatory Visit: Payer: Self-pay

## 2019-10-04 ENCOUNTER — Other Ambulatory Visit (HOSPITAL_COMMUNITY)
Admission: RE | Admit: 2019-10-04 | Discharge: 2019-10-04 | Disposition: A | Discharge status: Home / Self Care | Payer: BC Managed Care – PPO | Source: Ambulatory Visit | Attending: Neurosurgery | Admitting: Neurosurgery

## 2019-10-04 ENCOUNTER — Encounter (HOSPITAL_COMMUNITY): Payer: Self-pay | Admitting: *Deleted

## 2019-10-04 DIAGNOSIS — Z20822 Contact with and (suspected) exposure to covid-19: Secondary | ICD-10-CM | POA: Insufficient documentation

## 2019-10-04 DIAGNOSIS — Z01812 Encounter for preprocedural laboratory examination: Secondary | ICD-10-CM | POA: Insufficient documentation

## 2019-10-04 LAB — SARS CORONAVIRUS 2 (TAT 6-24 HRS): SARS Coronavirus 2: NEGATIVE

## 2019-10-04 NOTE — Progress Notes (Signed)
Pt denies SOB, chest pain, and being under the care of a cardiologist. Pt stated that PCP is [name]. Pt denies having a cardiac cath and echo. Pt denies having an EKG and chest x ray. Pt stated that last dose of Plavix and Aspirin was Thursday as instructed by MD. Pt made aware to stop taking  vitamins, fish oil and herbal medications. Do not take any NSAIDs ie: Diclofenac ( Voltaren) Ibuprofen, Advil, Naproxen (Aleve), Motrin, BC and Goody Powder. Pt made aware to hold Januvia and Metformin on DOS. Pt stated that she does not check her CBG. Pt reminded to quarantine. Pt verbalized understanding of all pre-op instructions.

## 2019-10-05 ENCOUNTER — Encounter (HOSPITAL_COMMUNITY): Payer: Self-pay

## 2019-10-05 ENCOUNTER — Encounter (HOSPITAL_COMMUNITY)
Admission: RE | Disposition: A | Discharge status: Home / Self Care | Payer: Self-pay | Source: Home / Self Care | Attending: Neurosurgery

## 2019-10-05 ENCOUNTER — Ambulatory Visit (HOSPITAL_COMMUNITY): Payer: BC Managed Care – PPO | Admitting: Anesthesiology

## 2019-10-05 ENCOUNTER — Other Ambulatory Visit: Payer: Self-pay

## 2019-10-05 ENCOUNTER — Ambulatory Visit (HOSPITAL_COMMUNITY): Payer: BC Managed Care – PPO

## 2019-10-05 ENCOUNTER — Ambulatory Visit (HOSPITAL_COMMUNITY)
Admission: RE | Admit: 2019-10-05 | Discharge: 2019-10-06 | Disposition: A | Discharge status: Home / Self Care | Payer: BC Managed Care – PPO | Attending: Neurosurgery | Admitting: Neurosurgery

## 2019-10-05 DIAGNOSIS — Z7902 Long term (current) use of antithrombotics/antiplatelets: Secondary | ICD-10-CM | POA: Diagnosis not present

## 2019-10-05 DIAGNOSIS — M1990 Unspecified osteoarthritis, unspecified site: Secondary | ICD-10-CM | POA: Diagnosis not present

## 2019-10-05 DIAGNOSIS — M2578 Osteophyte, vertebrae: Secondary | ICD-10-CM | POA: Insufficient documentation

## 2019-10-05 DIAGNOSIS — M545 Low back pain: Secondary | ICD-10-CM | POA: Insufficient documentation

## 2019-10-05 DIAGNOSIS — I10 Essential (primary) hypertension: Secondary | ICD-10-CM | POA: Insufficient documentation

## 2019-10-05 DIAGNOSIS — K589 Irritable bowel syndrome without diarrhea: Secondary | ICD-10-CM | POA: Diagnosis not present

## 2019-10-05 DIAGNOSIS — F419 Anxiety disorder, unspecified: Secondary | ICD-10-CM | POA: Insufficient documentation

## 2019-10-05 DIAGNOSIS — F17210 Nicotine dependence, cigarettes, uncomplicated: Secondary | ICD-10-CM | POA: Diagnosis not present

## 2019-10-05 DIAGNOSIS — M4802 Spinal stenosis, cervical region: Secondary | ICD-10-CM | POA: Insufficient documentation

## 2019-10-05 DIAGNOSIS — Z791 Long term (current) use of non-steroidal anti-inflammatories (NSAID): Secondary | ICD-10-CM | POA: Insufficient documentation

## 2019-10-05 DIAGNOSIS — E1151 Type 2 diabetes mellitus with diabetic peripheral angiopathy without gangrene: Secondary | ICD-10-CM | POA: Diagnosis not present

## 2019-10-05 DIAGNOSIS — G992 Myelopathy in diseases classified elsewhere: Secondary | ICD-10-CM | POA: Diagnosis not present

## 2019-10-05 DIAGNOSIS — Z882 Allergy status to sulfonamides status: Secondary | ICD-10-CM | POA: Insufficient documentation

## 2019-10-05 DIAGNOSIS — K219 Gastro-esophageal reflux disease without esophagitis: Secondary | ICD-10-CM | POA: Diagnosis not present

## 2019-10-05 DIAGNOSIS — Z79899 Other long term (current) drug therapy: Secondary | ICD-10-CM | POA: Insufficient documentation

## 2019-10-05 DIAGNOSIS — Z888 Allergy status to other drugs, medicaments and biological substances status: Secondary | ICD-10-CM | POA: Insufficient documentation

## 2019-10-05 DIAGNOSIS — J45909 Unspecified asthma, uncomplicated: Secondary | ICD-10-CM | POA: Insufficient documentation

## 2019-10-05 DIAGNOSIS — Z7982 Long term (current) use of aspirin: Secondary | ICD-10-CM | POA: Insufficient documentation

## 2019-10-05 DIAGNOSIS — Z7984 Long term (current) use of oral hypoglycemic drugs: Secondary | ICD-10-CM | POA: Insufficient documentation

## 2019-10-05 DIAGNOSIS — Z885 Allergy status to narcotic agent status: Secondary | ICD-10-CM | POA: Insufficient documentation

## 2019-10-05 DIAGNOSIS — Z419 Encounter for procedure for purposes other than remedying health state, unspecified: Secondary | ICD-10-CM

## 2019-10-05 HISTORY — DX: Presence of spectacles and contact lenses: Z97.3

## 2019-10-05 HISTORY — DX: Pneumonia, unspecified organism: J18.9

## 2019-10-05 HISTORY — DX: Other spondylosis with myelopathy, cervical region: M47.12

## 2019-10-05 HISTORY — DX: Headache, unspecified: R51.9

## 2019-10-05 HISTORY — PX: ANTERIOR CERVICAL DECOMPRESSION/DISCECTOMY FUSION 4 LEVELS: SHX5556

## 2019-10-05 LAB — GLUCOSE, CAPILLARY
Glucose-Capillary: 115 mg/dL — ABNORMAL HIGH (ref 70–99)
Glucose-Capillary: 206 mg/dL — ABNORMAL HIGH (ref 70–99)
Glucose-Capillary: 238 mg/dL — ABNORMAL HIGH (ref 70–99)
Glucose-Capillary: 251 mg/dL — ABNORMAL HIGH (ref 70–99)

## 2019-10-05 LAB — SURGICAL PCR SCREEN
MRSA, PCR: NEGATIVE
Staphylococcus aureus: NEGATIVE

## 2019-10-05 LAB — HEMOGLOBIN A1C
Hgb A1c MFr Bld: 6.4 % — ABNORMAL HIGH (ref 4.8–5.6)
Mean Plasma Glucose: 136.98 mg/dL

## 2019-10-05 LAB — TYPE AND SCREEN
ABO/RH(D): O POS
Antibody Screen: NEGATIVE

## 2019-10-05 LAB — HCG, SERUM, QUALITATIVE: Preg, Serum: NEGATIVE

## 2019-10-05 SURGERY — ANTERIOR CERVICAL DECOMPRESSION/DISCECTOMY FUSION 4 LEVELS
Anesthesia: General

## 2019-10-05 MED ORDER — CEFAZOLIN SODIUM-DEXTROSE 2-4 GM/100ML-% IV SOLN
2.0000 g | INTRAVENOUS | Status: AC
  Administered 2019-10-05 (×2): 2 g via INTRAVENOUS

## 2019-10-05 MED ORDER — ONDANSETRON HCL 4 MG/2ML IJ SOLN
INTRAMUSCULAR | Status: DC | PRN
  Administered 2019-10-05: 4 mg via INTRAVENOUS

## 2019-10-05 MED ORDER — CHLORHEXIDINE GLUCONATE CLOTH 2 % EX PADS: 6.0000 | MEDICATED_PAD | Freq: Once | CUTANEOUS | Status: DC

## 2019-10-05 MED ORDER — FLEET ENEMA 7-19 GM/118ML RE ENEM: 1.0000 | ENEMA | Freq: Once | RECTAL | Status: DC | PRN

## 2019-10-05 MED ORDER — CHLORHEXIDINE GLUCONATE 0.12 % MT SOLN
OROMUCOSAL | Status: AC
  Administered 2019-10-05: 15 mL via OROMUCOSAL
  Filled 2019-10-05: qty 15

## 2019-10-05 MED ORDER — CEFAZOLIN SODIUM-DEXTROSE 2-4 GM/100ML-% IV SOLN
2.0000 g | Freq: Three times a day (TID) | INTRAVENOUS | Status: DC
  Administered 2019-10-06: 2 g via INTRAVENOUS
  Filled 2019-10-05 (×2): qty 100

## 2019-10-05 MED ORDER — CHLORHEXIDINE GLUCONATE 0.12 % MT SOLN: 15.0000 mL | Freq: Once | OROMUCOSAL | Status: AC

## 2019-10-05 MED ORDER — SODIUM CHLORIDE 0.9% FLUSH: 3.0000 mL | INTRAVENOUS | Status: DC | PRN

## 2019-10-05 MED ORDER — DEXMEDETOMIDINE HCL IN NACL 200 MCG/50ML IV SOLN
INTRAVENOUS | Status: AC
  Filled 2019-10-05: qty 50

## 2019-10-05 MED ORDER — ACETAMINOPHEN 325 MG PO TABS
650.0000 mg | ORAL_TABLET | ORAL | Status: DC | PRN
  Administered 2019-10-05: 650 mg via ORAL
  Filled 2019-10-05: qty 2

## 2019-10-05 MED ORDER — MENTHOL 3 MG MT LOZG: 1.0000 | LOZENGE | OROMUCOSAL | Status: DC | PRN

## 2019-10-05 MED ORDER — ACETAMINOPHEN 10 MG/ML IV SOLN: 1000.0000 mg | Freq: Once | INTRAVENOUS | Status: DC | PRN

## 2019-10-05 MED ORDER — ALPRAZOLAM 0.5 MG PO TABS: 0.5000 mg | ORAL_TABLET | Freq: Two times a day (BID) | ORAL | Status: DC | PRN

## 2019-10-05 MED ORDER — ORAL CARE MOUTH RINSE: 15.0000 mL | Freq: Once | OROMUCOSAL | Status: AC

## 2019-10-05 MED ORDER — FENTANYL CITRATE (PF) 100 MCG/2ML IJ SOLN
INTRAMUSCULAR | Status: DC | PRN
  Administered 2019-10-05 (×4): 100 ug via INTRAVENOUS
  Administered 2019-10-05 (×2): 50 ug via INTRAVENOUS

## 2019-10-05 MED ORDER — IRBESARTAN 150 MG PO TABS
150.0000 mg | ORAL_TABLET | Freq: Every day | ORAL | Status: DC
  Administered 2019-10-05: 150 mg via ORAL
  Filled 2019-10-05 (×2): qty 1

## 2019-10-05 MED ORDER — PROPOFOL 10 MG/ML IV BOLUS
INTRAVENOUS | Status: DC | PRN
  Administered 2019-10-05: 140 mg via INTRAVENOUS

## 2019-10-05 MED ORDER — LIDOCAINE 2% (20 MG/ML) 5 ML SYRINGE
INTRAMUSCULAR | Status: AC
  Filled 2019-10-05: qty 5

## 2019-10-05 MED ORDER — DEXAMETHASONE SODIUM PHOSPHATE 10 MG/ML IJ SOLN
INTRAMUSCULAR | Status: DC | PRN
  Administered 2019-10-05: 10 mg via INTRAVENOUS

## 2019-10-05 MED ORDER — OXYCODONE-ACETAMINOPHEN 5-325 MG PO TABS
ORAL_TABLET | ORAL | Status: AC
  Filled 2019-10-05: qty 2

## 2019-10-05 MED ORDER — CEFAZOLIN SODIUM 1 G IJ SOLR
INTRAMUSCULAR | Status: AC
  Filled 2019-10-05: qty 20

## 2019-10-05 MED ORDER — MORPHINE SULFATE (PF) 2 MG/ML IV SOLN: 2.0000 mg | INTRAVENOUS | Status: DC | PRN

## 2019-10-05 MED ORDER — FENTANYL CITRATE (PF) 100 MCG/2ML IJ SOLN
INTRAMUSCULAR | Status: AC
  Filled 2019-10-05: qty 2

## 2019-10-05 MED ORDER — ROCURONIUM BROMIDE 100 MG/10ML IV SOLN
INTRAVENOUS | Status: DC | PRN
  Administered 2019-10-05: 60 mg via INTRAVENOUS
  Administered 2019-10-05: 50 mg via INTRAVENOUS
  Administered 2019-10-05: 40 mg via INTRAVENOUS
  Administered 2019-10-05: 20 mg via INTRAVENOUS
  Administered 2019-10-05: 30 mg via INTRAVENOUS

## 2019-10-05 MED ORDER — PHENYLEPHRINE HCL (PRESSORS) 10 MG/ML IV SOLN
INTRAVENOUS | Status: DC | PRN
  Administered 2019-10-05 (×4): 80 ug via INTRAVENOUS

## 2019-10-05 MED ORDER — LIDOCAINE HCL (CARDIAC) PF 100 MG/5ML IV SOSY
PREFILLED_SYRINGE | INTRAVENOUS | Status: DC | PRN
  Administered 2019-10-05: 60 mg via INTRAVENOUS

## 2019-10-05 MED ORDER — ROCURONIUM BROMIDE 10 MG/ML (PF) SYRINGE
PREFILLED_SYRINGE | INTRAVENOUS | Status: AC
  Filled 2019-10-05: qty 10

## 2019-10-05 MED ORDER — FENTANYL CITRATE (PF) 250 MCG/5ML IJ SOLN
INTRAMUSCULAR | Status: AC
  Filled 2019-10-05: qty 5

## 2019-10-05 MED ORDER — SODIUM CHLORIDE 0.9% FLUSH
3.0000 mL | Freq: Two times a day (BID) | INTRAVENOUS | Status: DC
  Administered 2019-10-05 (×2): 3 mL via INTRAVENOUS

## 2019-10-05 MED ORDER — DEXMEDETOMIDINE HCL 200 MCG/2ML IV SOLN
INTRAVENOUS | Status: DC | PRN
  Administered 2019-10-05: 20 ug via INTRAVENOUS
  Administered 2019-10-05: 12 ug via INTRAVENOUS

## 2019-10-05 MED ORDER — DOCUSATE SODIUM 100 MG PO CAPS
100.0000 mg | ORAL_CAPSULE | Freq: Two times a day (BID) | ORAL | Status: DC
  Administered 2019-10-05: 100 mg via ORAL
  Filled 2019-10-05: qty 1

## 2019-10-05 MED ORDER — ALBUTEROL SULFATE HFA 108 (90 BASE) MCG/ACT IN AERS: 2.0000 | INHALATION_SPRAY | Freq: Four times a day (QID) | RESPIRATORY_TRACT | Status: DC | PRN

## 2019-10-05 MED ORDER — ACETAMINOPHEN 160 MG/5ML PO SOLN: 1000.0000 mg | Freq: Once | ORAL | Status: DC | PRN

## 2019-10-05 MED ORDER — MIDAZOLAM HCL 5 MG/5ML IJ SOLN
INTRAMUSCULAR | Status: DC | PRN
  Administered 2019-10-05 (×2): 2 mg via INTRAVENOUS

## 2019-10-05 MED ORDER — ALBUMIN HUMAN 5 % IV SOLN
INTRAVENOUS | Status: DC | PRN
Start: 2019-10-05 — End: 2019-10-05

## 2019-10-05 MED ORDER — ONDANSETRON HCL 4 MG/2ML IJ SOLN: 4.0000 mg | Freq: Four times a day (QID) | INTRAMUSCULAR | Status: DC | PRN

## 2019-10-05 MED ORDER — THROMBIN 5000 UNITS EX SOLR
OROMUCOSAL | Status: DC | PRN
  Administered 2019-10-05: 5 mL via TOPICAL

## 2019-10-05 MED ORDER — OXYCODONE-ACETAMINOPHEN 5-325 MG PO TABS
1.0000 | ORAL_TABLET | Freq: Four times a day (QID) | ORAL | Status: DC | PRN
  Administered 2019-10-05 – 2019-10-06 (×3): 2 via ORAL
  Administered 2019-10-06: 1 via ORAL
  Filled 2019-10-05 (×3): qty 2

## 2019-10-05 MED ORDER — FENTANYL CITRATE (PF) 100 MCG/2ML IJ SOLN
25.0000 ug | INTRAMUSCULAR | Status: DC | PRN
  Administered 2019-10-05: 25 ug via INTRAVENOUS

## 2019-10-05 MED ORDER — CEFAZOLIN SODIUM-DEXTROSE 2-4 GM/100ML-% IV SOLN
INTRAVENOUS | Status: AC
  Administered 2019-10-05: 2 g via INTRAVENOUS
  Filled 2019-10-05: qty 100

## 2019-10-05 MED ORDER — POTASSIUM CHLORIDE IN NACL 20-0.9 MEQ/L-% IV SOLN: INTRAVENOUS | Status: DC

## 2019-10-05 MED ORDER — THROMBIN 5000 UNITS EX SOLR
CUTANEOUS | Status: AC
  Filled 2019-10-05: qty 5000

## 2019-10-05 MED ORDER — ACETAMINOPHEN 500 MG PO TABS: 1000.0000 mg | ORAL_TABLET | Freq: Once | ORAL | Status: DC | PRN

## 2019-10-05 MED ORDER — LACTATED RINGERS IV SOLN: INTRAVENOUS | Status: DC

## 2019-10-05 MED ORDER — PHENYLEPHRINE HCL-NACL 10-0.9 MG/250ML-% IV SOLN
INTRAVENOUS | Status: DC | PRN
  Administered 2019-10-05: 40 ug/min via INTRAVENOUS

## 2019-10-05 MED ORDER — PROPOFOL 10 MG/ML IV BOLUS
INTRAVENOUS | Status: AC
  Filled 2019-10-05: qty 20

## 2019-10-05 MED ORDER — PHENOL 1.4 % MT LIQD: 1.0000 | OROMUCOSAL | Status: DC | PRN

## 2019-10-05 MED ORDER — CITALOPRAM HYDROBROMIDE 20 MG PO TABS
20.0000 mg | ORAL_TABLET | Freq: Every day | ORAL | Status: DC
  Administered 2019-10-05: 20 mg via ORAL
  Filled 2019-10-05: qty 1

## 2019-10-05 MED ORDER — 0.9 % SODIUM CHLORIDE (POUR BTL) OPTIME
TOPICAL | Status: DC | PRN
  Administered 2019-10-05: 1000 mL

## 2019-10-05 MED ORDER — OXYCODONE HCL 5 MG/5ML PO SOLN: 5.0000 mg | Freq: Once | ORAL | Status: DC | PRN

## 2019-10-05 MED ORDER — ACYCLOVIR 400 MG PO TABS
400.0000 mg | ORAL_TABLET | Freq: Two times a day (BID) | ORAL | Status: DC | PRN
  Filled 2019-10-05: qty 1

## 2019-10-05 MED ORDER — ACETAMINOPHEN 10 MG/ML IV SOLN
INTRAVENOUS | Status: AC
  Filled 2019-10-05: qty 100

## 2019-10-05 MED ORDER — MIDAZOLAM HCL 2 MG/2ML IJ SOLN
INTRAMUSCULAR | Status: AC
  Filled 2019-10-05: qty 2

## 2019-10-05 MED ORDER — SODIUM CHLORIDE 0.9 % IV SOLN
INTRAVENOUS | Status: DC | PRN
  Administered 2019-10-05: 500 mL

## 2019-10-05 MED ORDER — LINAGLIPTIN 5 MG PO TABS
5.0000 mg | ORAL_TABLET | Freq: Every day | ORAL | Status: DC
  Administered 2019-10-05: 5 mg via ORAL
  Filled 2019-10-05 (×2): qty 1

## 2019-10-05 MED ORDER — ACETAMINOPHEN 10 MG/ML IV SOLN
INTRAVENOUS | Status: DC | PRN
Start: 2019-10-05 — End: 2019-10-05
  Administered 2019-10-05: 1000 mg via INTRAVENOUS

## 2019-10-05 MED ORDER — ONDANSETRON HCL 4 MG PO TABS: 4.0000 mg | ORAL_TABLET | Freq: Four times a day (QID) | ORAL | Status: DC | PRN

## 2019-10-05 MED ORDER — LIDOCAINE-EPINEPHRINE 1 %-1:100000 IJ SOLN
INTRAMUSCULAR | Status: AC
  Filled 2019-10-05: qty 1

## 2019-10-05 MED ORDER — PRAVASTATIN SODIUM 40 MG PO TABS
40.0000 mg | ORAL_TABLET | Freq: Every day | ORAL | Status: DC
  Administered 2019-10-05: 40 mg via ORAL
  Filled 2019-10-05: qty 1

## 2019-10-05 MED ORDER — ONDANSETRON HCL 4 MG/2ML IJ SOLN
INTRAMUSCULAR | Status: AC
  Filled 2019-10-05: qty 2

## 2019-10-05 MED ORDER — ACETAMINOPHEN 650 MG RE SUPP: 650.0000 mg | RECTAL | Status: DC | PRN

## 2019-10-05 MED ORDER — LOSARTAN POTASSIUM 50 MG PO TABS: 100.0000 mg | ORAL_TABLET | Freq: Every day | ORAL | Status: DC

## 2019-10-05 MED ORDER — LIDOCAINE-EPINEPHRINE 1 %-1:100000 IJ SOLN
INTRAMUSCULAR | Status: DC | PRN
  Administered 2019-10-05: 8 mL

## 2019-10-05 MED ORDER — BACLOFEN 20 MG PO TABS
20.0000 mg | ORAL_TABLET | Freq: Three times a day (TID) | ORAL | Status: DC | PRN
  Administered 2019-10-05: 20 mg via ORAL
  Filled 2019-10-05 (×2): qty 1

## 2019-10-05 MED ORDER — PANTOPRAZOLE SODIUM 40 MG PO TBEC: 40.0000 mg | DELAYED_RELEASE_TABLET | Freq: Every day | ORAL | Status: DC

## 2019-10-05 MED ORDER — FUROSEMIDE 40 MG PO TABS: 40.0000 mg | ORAL_TABLET | Freq: Every day | ORAL | Status: DC | PRN

## 2019-10-05 MED ORDER — ALBUTEROL SULFATE (2.5 MG/3ML) 0.083% IN NEBU: 2.5000 mg | INHALATION_SOLUTION | Freq: Four times a day (QID) | RESPIRATORY_TRACT | Status: DC | PRN

## 2019-10-05 MED ORDER — OXYCODONE HCL 5 MG PO TABS: 5.0000 mg | ORAL_TABLET | Freq: Once | ORAL | Status: DC | PRN

## 2019-10-05 MED ORDER — SUGAMMADEX SODIUM 200 MG/2ML IV SOLN
INTRAVENOUS | Status: DC | PRN
  Administered 2019-10-05: 200 mg via INTRAVENOUS

## 2019-10-05 MED ORDER — SODIUM CHLORIDE 0.9 % IV SOLN
250.0000 mL | INTRAVENOUS | Status: DC
  Administered 2019-10-05: 250 mL via INTRAVENOUS

## 2019-10-05 MED ORDER — POLYETHYLENE GLYCOL 3350 17 G PO PACK: 17.0000 g | PACK | Freq: Every day | ORAL | Status: DC | PRN

## 2019-10-05 MED ORDER — METFORMIN HCL 500 MG PO TABS
1000.0000 mg | ORAL_TABLET | Freq: Two times a day (BID) | ORAL | Status: DC
  Administered 2019-10-05: 1000 mg via ORAL
  Filled 2019-10-05: qty 2

## 2019-10-05 SURGICAL SUPPLY — 78 items
APL SKNCLS STERI-STRIP NONHPOA (GAUZE/BANDAGES/DRESSINGS) ×1
BAG DECANTER FOR FLEXI CONT (MISCELLANEOUS) ×3 IMPLANT
BAND INSRT 18 STRL LF DISP RB (MISCELLANEOUS) ×2
BAND RUBBER #18 3X1/16 STRL (MISCELLANEOUS) ×6 IMPLANT
BENZOIN TINCTURE PRP APPL 2/3 (GAUZE/BANDAGES/DRESSINGS) ×3 IMPLANT
BIT DRILL 13 (BIT) ×1 IMPLANT
BIT DRILL 13MM (BIT) ×1
BIT DRILL NEURO 2X3.1 SFT TUCH (MISCELLANEOUS) ×1 IMPLANT
BLADE CLIPPER SURG (BLADE) IMPLANT
BLADE ULTRA TIP 2M (BLADE) IMPLANT
BUR MATCHSTICK NEURO 3.0 LAGG (BURR) ×2 IMPLANT
CAGE LORDOTIC 6 SM (Cage) ×1 IMPLANT
CAGE LORDOTIC 6MM SM (Cage) ×1 IMPLANT
CANISTER SUCT 3000ML PPV (MISCELLANEOUS) ×3 IMPLANT
CARTRIDGE OIL MAESTRO DRILL (MISCELLANEOUS) ×1 IMPLANT
CLOSURE WOUND 1/2 X4 (GAUZE/BANDAGES/DRESSINGS) ×2
COLLAR CERV LO CONTOUR FIRM DE (SOFTGOODS) ×3 IMPLANT
COVER WAND RF STERILE (DRAPES) ×3 IMPLANT
DECANTER SPIKE VIAL GLASS SM (MISCELLANEOUS) ×3 IMPLANT
DEVICE ENDSKLTN IMPLANT SM 7MM (Cage) IMPLANT
DRAPE C-ARM 42X72 X-RAY (DRAPES) ×3 IMPLANT
DRAPE C-ARMOR (DRAPES) ×3 IMPLANT
DRAPE LAPAROTOMY 100X72 PEDS (DRAPES) ×3 IMPLANT
DRAPE MICROSCOPE LEICA (MISCELLANEOUS) ×3 IMPLANT
DRAPE SHEET LG 3/4 BI-LAMINATE (DRAPES) ×3 IMPLANT
DRILL NEURO 2X3.1 SOFT TOUCH (MISCELLANEOUS) ×3
DRSG OPSITE 4X5.5 SM (GAUZE/BANDAGES/DRESSINGS) ×2 IMPLANT
DRSG OPSITE POSTOP 3X4 (GAUZE/BANDAGES/DRESSINGS) ×3 IMPLANT
DRSG OPSITE POSTOP 4X6 (GAUZE/BANDAGES/DRESSINGS) ×2 IMPLANT
DURAPREP 26ML APPLICATOR (WOUND CARE) ×3 IMPLANT
ELECT COATED BLADE 2.86 ST (ELECTRODE) ×3 IMPLANT
ELECT REM PT RETURN 9FT ADLT (ELECTROSURGICAL) ×3
ELECTRODE REM PT RTRN 9FT ADLT (ELECTROSURGICAL) ×1 IMPLANT
ENDOSKELETON IMPLANT SM 7MM (Cage) ×9 IMPLANT
EVACUATOR 1/8 PVC DRAIN (DRAIN) ×2 IMPLANT
GAUZE 4X4 16PLY RFD (DISPOSABLE) IMPLANT
GAUZE SPONGE 4X4 12PLY STRL (GAUZE/BANDAGES/DRESSINGS) ×2 IMPLANT
GLOVE BIOGEL PI IND STRL 7.0 (GLOVE) IMPLANT
GLOVE BIOGEL PI IND STRL 7.5 (GLOVE) ×1 IMPLANT
GLOVE BIOGEL PI INDICATOR 7.0 (GLOVE) ×4
GLOVE BIOGEL PI INDICATOR 7.5 (GLOVE) ×6
GLOVE ECLIPSE 7.5 STRL STRAW (GLOVE) ×1 IMPLANT
GLOVE SURG SS PI 7.0 STRL IVOR (GLOVE) ×6 IMPLANT
GLOVE SURG SS PI 7.5 STRL IVOR (GLOVE) ×4 IMPLANT
GLOVE SURG SS PI 8.0 STRL IVOR (GLOVE) ×2 IMPLANT
GOWN STRL REUS W/ TWL LRG LVL3 (GOWN DISPOSABLE) ×2 IMPLANT
GOWN STRL REUS W/ TWL XL LVL3 (GOWN DISPOSABLE) ×1 IMPLANT
GOWN STRL REUS W/TWL 2XL LVL3 (GOWN DISPOSABLE) ×2 IMPLANT
GOWN STRL REUS W/TWL LRG LVL3 (GOWN DISPOSABLE) ×9
GOWN STRL REUS W/TWL XL LVL3 (GOWN DISPOSABLE) ×3
HEMOSTAT POWDER KIT SURGIFOAM (HEMOSTASIS) ×3 IMPLANT
KIT BASIN OR (CUSTOM PROCEDURE TRAY) ×3 IMPLANT
KIT TURNOVER KIT B (KITS) ×3 IMPLANT
NDL SPNL 22GX3.5 QUINCKE BK (NEEDLE) ×1 IMPLANT
NEEDLE HYPO 22GX1.5 SAFETY (NEEDLE) ×3 IMPLANT
NEEDLE SPNL 22GX3.5 QUINCKE BK (NEEDLE) ×3 IMPLANT
NS IRRIG 1000ML POUR BTL (IV SOLUTION) ×3 IMPLANT
OIL CARTRIDGE MAESTRO DRILL (MISCELLANEOUS)
PACK LAMINECTOMY NEURO (CUSTOM PROCEDURE TRAY) ×3 IMPLANT
PAD ARMBOARD 7.5X6 YLW CONV (MISCELLANEOUS) ×3 IMPLANT
PATTIES SURGICAL .5 X3 (DISPOSABLE) ×3 IMPLANT
PIN DISTRACTION 14MM (PIN) ×4 IMPLANT
PIN ZEVO PLATE HOLDING (PIN) ×2 IMPLANT
PLATE ZEVO 4LVL 73MM (Plate) ×2 IMPLANT
PUTTY DBF 6CC CORTICAL FIBERS (Putty) ×2 IMPLANT
SCREW 13MM (Screw) ×20 IMPLANT
SPONGE INTESTINAL PEANUT (DISPOSABLE) ×3 IMPLANT
SPONGE SURGIFOAM ABS GEL SZ50 (HEMOSTASIS) IMPLANT
STAPLER VISISTAT 35W (STAPLE) ×3 IMPLANT
STRIP CLOSURE SKIN 1/2X4 (GAUZE/BANDAGES/DRESSINGS) ×3 IMPLANT
SUT MNCRL AB 4-0 PS2 18 (SUTURE) ×5 IMPLANT
SUT SILK 2 0 TIES 10X30 (SUTURE) IMPLANT
SUT VIC AB 3-0 SH 8-18 (SUTURE) ×5 IMPLANT
TAPE CLOTH 3X10 TAN LF (GAUZE/BANDAGES/DRESSINGS) ×3 IMPLANT
TIP KERRISON THIN FOOTPLATE 2M (MISCELLANEOUS) ×2 IMPLANT
TOWEL GREEN STERILE (TOWEL DISPOSABLE) ×3 IMPLANT
TOWEL GREEN STERILE FF (TOWEL DISPOSABLE) ×3 IMPLANT
WATER STERILE IRR 1000ML POUR (IV SOLUTION) ×3 IMPLANT

## 2019-10-05 NOTE — Progress Notes (Signed)
Neurosurgery postop check  Pt seen and examined.  Strength stable from preop, dressing c/d, drain in place.  Patient will require overnight/extended stay for drain management and pain control, as well as neurologic monitoring given the extensive surgery.

## 2019-10-05 NOTE — Anesthesia Preprocedure Evaluation (Addendum)
Anesthesia Evaluation  Patient identified by MRN, date of birth, ID band Patient awake    Reviewed: Allergy & Precautions, NPO status , Patient's Chart, lab work & pertinent test results  History of Anesthesia Complications Negative for: history of anesthetic complications  Airway Mallampati: I  TM Distance: >3 FB Neck ROM: Limited    Dental  (+) Poor Dentition, Dental Advisory Given   Pulmonary asthma , neg recent URI, Current Smoker and Patient abstained from smoking., former smoker,    breath sounds clear to auscultation       Cardiovascular hypertension, Pt. on medications (-) angina+ Peripheral Vascular Disease  (-) Past MI and (-) CHF  Rhythm:Regular     Neuro/Psych  Headaches, Anxiety  Neuromuscular disease    GI/Hepatic Neg liver ROS, GERD  Medicated,  Endo/Other  diabetes  Renal/GU negative Renal ROS     Musculoskeletal  (+) Arthritis ,   Abdominal   Peds  Hematology negative hematology ROS (+)   Anesthesia Other Findings   Reproductive/Obstetrics                           Anesthesia Physical Anesthesia Plan  ASA: II  Anesthesia Plan: General   Post-op Pain Management:    Induction: Intravenous  PONV Risk Score and Plan: 3 and Ondansetron, Dexamethasone and Midazolam  Airway Management Planned: Oral ETT  Additional Equipment: None  Intra-op Plan:   Post-operative Plan: Extubation in OR  Informed Consent: I have reviewed the patients History and Physical, chart, labs and discussed the procedure including the risks, benefits and alternatives for the proposed anesthesia with the patient or authorized representative who has indicated his/her understanding and acceptance.     Dental advisory given  Plan Discussed with: CRNA and Surgeon  Anesthesia Plan Comments:         Anesthesia Quick Evaluation

## 2019-10-05 NOTE — Op Note (Signed)
PREOP DIAGNOSIS: Critical cervical stenosis with myelopathy  POSTOP DIAGNOSIS: Critical cervical stenosis with myelopathy  PROCEDURE: 1. Arthrodesis C3-4, anterior interbody technique, including Discectomy for decompression of spinal cord and exiting nerve roots with foraminotomies  2. Arthrodesis, additional level C4-5 anterior interbody technique, including Discectomy for decompression of spinal cord and exiting nerve roots with foraminotomies  3. Arthrodesis, additional level C5-6 anterior interbody technique, including Discectomy for decompression of spinal cord and exiting nerve roots with foraminotomies  4. Arthrodesis, additional level C6-7 anterior interbody technique, including Discectomy for decompression of spinal cord and exiting nerve roots with foraminotomies  5. Placement of intervertebral biomechanical device C3-4 6. Placement of intervertebral biomechanical device C4-5 7. Placement of intervertebral biomechanical device C4-5 8. Placement of intervertebral biomechanical device C4-5 9. Placement of anterior instrumentation consisting of interbody plate and screws across 5 vertebral segments (C3-7) 10. Use of morselized bone allograft  11. Use of intraoperative microscope  SURGEON: Dr. Duffy Rhody, MD  ASSISTANT: Dr. Kristeen Miss.  Please note, no qualified trainees were available to assist with the procedure.  Assistance was required for retraction of the visceral structures to safely allow for instrumentation.  ANESTHESIA: General Endotracheal  EBL: 50 ml  IMPLANTS: 7 mm Titan TC Nano cage at C3-4, C4-5, and C6-7.  6 mm Titan TC Nano cage at C5-6.  73 mm Zevo plate with 3.5 x 13 mm screws bilaterally at C3, C4, C5, C6 and C7  SPECIMENS: None  DRAINS: Neck drain  COMPLICATIONS: None immediate  CONDITION: Hemodynamically stable to PACU  HISTORY: This is a 54 year old woman who presented with progressive numbness in her arms and hands and hand weakness as well as  Lhermitte sign.  She was found to have severe cervical cord impingement due to large disc osteophyte complexes, especially at C3-4 where there was myelomalacia and cord signal change.  Given the rapidly progressive development of her symptoms and the critical stenosis seen on imaging, cervical decompression was recommended.  4 level ACDF was recommended as her compressive pathology was centered in her degenerated disks.  Risks, benefits, alternatives, and expected convalescence were discussed with the patient.  Risks discussed included but were not limited to bleeding, pain, infection, pseudoarthrosis, hardware failure, adjacent segment disease, CSF leak, neurologic deficits, weakness, numbness, paralysis, coma, and death. After all questions were answered, informed consent was obtained.  PROCEDURE IN DETAIL: The patient was brought to the operating room and transferred to the operative table. After induction of general anesthesia, the patient was positioned on the operative table in the supine position with all pressure points meticulously padded. The skin of the neck was then prepped and draped in the usual sterile fashion.  After timeout was conducted, the skin was infiltrated with local anesthetic. Skin incision was then made sharply and Bovie electrocautery was used to dissect the subcutaneous tissue until the platysma was identified. The platysma was then divided longitudinally and undermined. The sternocleidomastoid muscle was then identified and, utilizing natural fascial planes in the neck, the prevertebral fascia was identified and the carotid sheath was retracted laterally and the trachea and esophagus retracted medially. Again using fluoroscopy, the disc spaces were localized. Bovie electrocautery was used to dissect in the subperiosteal plane and elevate the bilateral longus coli muscles. Self-retaining retractors were then placed. Caspar distraction pins were placed in the adjacent bodies to allow  for gentle distraction.  At this point, the microscope was draped and brought into the field, and the remainder of the case was done under the  microscope using microdissecting technique.  The C3-4 disc space was incised sharply and combination of high speed drill, curettes, and rongeurs were use to initially complete a discectomy. The high-speed drill was then used to complete discectomy until the posterior annulus was identified and removed and the posterior longitudinal ligament was identified.  The posterior annulus was heavily calcified.  Using a nerve hook, the PLL was elevated, and Kerrison rongeurs were used to remove the posterior longitudinal ligament and the ventral thecal sac was identified. Using a combination of curettes and rongeurs, complete decompression of the thecal sac and exiting nerve roots at this level was completed, and verified with easy passage of micro-nerve hook centrally and in the bilateral foramina. Having completed our decompression, attention was turned to placement of the intervertebral device. Trial spacers were used to select a size 7 mm graft. This graft was then filled with morcellized allograft, and inserted under live fluoroscopy.  Attention was then turned to the C4-5 level.  The Caspar pin in the body of C3 was removed and caspar distraction pin was placed in C5 to allow for gentle distraction of the disc space.  In a similar fashion, discectomy was completed initially with curettes and rongeurs, and completed with the drill. The PLL was again identified, elevated and incised. Using Kerrison rongeurs, decompression of the spinal cord and exiting roots was completed and confirmed with a dissector. Trial spacers were used to select a 7 mm graft. This graft was then filled with morcellized allograft, and inserted under live fluoroscopy.  Attention was then turned to the C5-6 level.  The Caspar pin in the body of C4 was removed and caspar distraction pin was placed in C6  to allow for gentle distraction of the disc space.  In a similar fashion, discectomy was completed initially with curettes and rongeurs, and completed with the drill. The PLL was again identified, elevated and incised. Using Kerrison rongeurs, decompression of the spinal cord and exiting roots was completed and confirmed with a dissector. Trial spacers were used to select a 6 mm graft. This graft was then filled with morcellized allograft, and inserted under live fluoroscopy.  Attention was then turned to the C6--7 level.  The Caspar pin in the body of C5 was removed and caspar distraction pin was placed in C7 to allow for gentle distraction of the disc space.  This disc space was fairly collapsed but with a displaced distractor, it was successfully expanded with good restoration of disc space height.  In a similar fashion, discectomy was completed initially with curettes and rongeurs, and completed with the drill.  the PLL was again identified, elevated and incised.  Large osteophytes from the posterior corners of the body were removed with Kerrison rongeurs.  Using Kerrison rongeurs, decompression of the spinal cord and exiting roots was completed and confirmed with a dissector. Trial spacers were used to select a 7 mm graft. This graft was then filled with morcellized allograft, and inserted under live fluoroscopy.  After placement of the intervertebral devices, the caspar pins were removed.  A 73 mm anterior cervical plate was placed across the interspaces for anterior fixation.  Using a high-speed drill, the cortex of the cervical vertebral bodies was punctured, and screws inserted in the vertebral bodies. Final fluoroscopic images in [facility] and lateral projections were taken to confirm good hardware placement.  At this point, after all counts were verified to be correct, meticulous hemostasis was secured using a combination of bipolar electrocautery and passive hemostatics.  A medium Hemovac drain was placed  in the prevertebral space and tunneled out the skin.  The platysma muscle was then closed using interrupted 3-0 Vicryl sutures, and the skin was closed with a 4-0 monocryl in subcutical fashion. Sterile dressings were then applied and the drapes removed.  A collar was then applied.  The patient tolerated the procedure well and was extubated in the room and taken to the postanesthesia care unit in stable condition.  All counts were correct at the end of the procedure.

## 2019-10-05 NOTE — Transfer of Care (Signed)
Immediate Anesthesia Transfer of Care Note  Patient: ANALYSSE QUINONEZ  Procedure(s) Performed: ANTERIOR CERVICAL DECOMPRESSION/DISCECTOMY FUSION  CERVICAL THREE-FOUR- CERVICIAL FOUR-FIVE CERVICAL FIVE-SIX, CERVICAL SIX-SEVEN (N/A )  Patient Location: PACU  Anesthesia Type:General  Level of Consciousness: drowsy  Airway & Oxygen Therapy: Patient Spontanous Breathing and Patient connected to nasal cannula oxygen  Post-op Assessment: Report given to RN, Post -op Vital signs reviewed and stable and Patient moving all extremities  Post vital signs: Reviewed and stable  Last Vitals:  Vitals Value Taken Time  BP    Temp    Pulse    Resp    SpO2      Last Pain:  Vitals:   10/05/19 0658  PainSc: 0-No pain      Patients Stated Pain Goal: 8 (70/34/03 5248)  Complications: No complications documented.
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
## 2019-10-05 NOTE — Anesthesia Procedure Notes (Signed)
Procedure Name: Intubation Date/Time: 10/05/2019 8:13 AM Performed by: Darryn Kydd T, CRNA Pre-anesthesia Checklist: Patient identified, Emergency Drugs available, Suction available and Patient being monitored Patient Re-evaluated:Patient Re-evaluated prior to induction Oxygen Delivery Method: Circle system utilized Preoxygenation: Pre-oxygenation with 100% oxygen Induction Type: IV induction Ventilation: Mask ventilation without difficulty Laryngoscope Size: Glidescope and 3 Grade View: Grade I Tube type: Oral Tube size: 7.5 mm Number of attempts: 1 Airway Equipment and Method: Stylet,  Oral airway and Video-laryngoscopy Placement Confirmation: ETT inserted through vocal cords under direct vision,  positive ETCO2 and breath sounds checked- equal and bilateral Secured at: 21 cm Tube secured with: Tape Dental Injury: Teeth and Oropharynx as per pre-operative assessment

## 2019-10-05 NOTE — Progress Notes (Signed)
Orthopedic Tech Progress Note Patient Details:  Haley Dunn [birth year] [id number] Called in order to [facility] for [facility]. Patient ID: Haley Dunn, female   DOB: Jan 19, 1966, 54 y.o.   MRN: [id number]   Chip Boer 10/05/2019, 3:05 PM

## 2019-10-06 DIAGNOSIS — M4802 Spinal stenosis, cervical region: Secondary | ICD-10-CM | POA: Diagnosis not present

## 2019-10-06 LAB — GLUCOSE, CAPILLARY: Glucose-Capillary: 123 mg/dL — ABNORMAL HIGH (ref 70–99)

## 2019-10-06 MED ORDER — DOCUSATE SODIUM 100 MG PO CAPS: 100.0000 mg | ORAL_CAPSULE | Freq: Two times a day (BID) | ORAL | 2 refills | Status: DC

## 2019-10-06 MED ORDER — OXYCODONE-ACETAMINOPHEN 5-325 MG PO TABS: 1.0000 | ORAL_TABLET | Freq: Four times a day (QID) | ORAL | 0 refills | Status: DC | PRN

## 2019-10-06 NOTE — Plan of Care (Signed)
Patient alert and oriented, mae's well, voiding adequate amount of urine, swallowing without difficulty, no c/o pain at time of discharge. Patient discharged home with family. Script and discharged instructions given to patient. Patient and family stated understanding of instructions given. Patient has an appointment with Dr. Thomas ?

## 2019-10-06 NOTE — Discharge Instructions (Signed)
Wound Care °Keep incision area dry.  °You may remove outer bandage after 2 days and shower.  ° If you shower prior cover incision with plastic wrap.  °Do not put any creams, lotions, or ointments on incision. °Leave steri-strips on neck.  They will fall off by themselves. °Activity °Walk each and every day, increasing distance each day. °No lifting greater than 5 lbs.  Avoid excessive neck motion. °No driving for 2 weeks; may ride as a passenger locally. °Wear neck brace at all times except when showering. °Diet °Resume your normal diet.  °Return to Work °Will be discussed at you follow up appointment. °Call Your Doctor If Any of These Occur °Redness, drainage, or swelling at the wound.  °Temperature greater than 101 degrees. °Severe pain not relieved by pain medication. °Increased difficulty swallowing.  °Incision starts to come apart. °Follow Up Appt °Call [phone]) or for problems.  If you have any hardware placed in your spine, you will need an x-ray before your appointment.  °

## 2019-10-06 NOTE — Evaluation (Signed)
Occupational Therapy Evaluation and Discharge Patient Details Name: JOYCELIN Dunn MRN: [id number] DOB: [birth year] Today's Date: 10/06/2019    History of Present Illness Pt s/p C3-4, C4-5, C5-6, and C6-7 ACDF   Clinical Impression   This 54 yo female admitted and underwent above presents to acute OT with all education completed, we will D/C from acute OT.    Follow Up Recommendations  No OT follow up;Supervision - Intermittent    Equipment Recommendations  None recommended by OT       Precautions / Restrictions Precautions Precautions: Cervical Precaution Booklet Issued: Yes (comment) Required Braces or Orthoses: Cervical Brace Cervical Brace: Hard collar;At all times (soft collar for shower) Restrictions Weight Bearing Restrictions: No      Mobility Bed Mobility               General bed mobility comments: Pt reports no issues, I did go over with her rolling to get out of flat bed. Pt has recliner at home that she plans on being in most of the time in the short run  Transfers                 General transfer comment: Pt reports no issue with ambulation, she reports she has walked up and down the hallway once and 2 times to RN station. She was up in bathroom by herself when I returned to her room to deliver cups    Balance Overall balance assessment:  (no issues reported)                                         ADL either performed or assessed with clinical judgement   ADL                                         General ADL Comments: Educated patient and husband on wear and care of [place] select collar as well as soft collar for showering, using 2 cups for brushing teeth (one to spit and one to rinse with straw), only using cups with straws while wearing c-collar, covering chin area of c-collar when brushing teeth and eating.     Vision Patient Visual Report: No change from baseline              Pertinent  Vitals/Pain Pain Assessment: Faces Faces Pain Scale: Hurts a little bit Pain Location: right side of neck and across shoulders in back Pain Descriptors / Indicators: Sore Pain Intervention(s): Limited activity within patient's tolerance;Monitored during session;Repositioned;Ice applied     Hand Dominance Right   Extremity/Trunk Assessment Upper Extremity Assessment Upper Extremity Assessment:  (pt reports heaviness is alot better but still has some tingling/numbness in hands/fingers--still needing to use her vision to compensate some)   Lower Extremity Assessment Lower Extremity Assessment: Overall WFL for tasks assessed   Cervical / Trunk Assessment Cervical / Trunk Assessment: Normal   Communication Communication Communication: No difficulties   Cognition Arousal/Alertness: Awake/alert Behavior During Therapy: WFL for tasks assessed/performed Overall Cognitive Status: Within Functional Limits for tasks assessed  Home Living Family/patient expects to be discharged to:: Private residence Living Arrangements: Spouse/significant other Available Help at Discharge: Family;Available 24 hours/day Type of Home: House Home Access: Level entry     Home Layout: One level     Bathroom Shower/Tub: Occupational psychologist: Standard     Home Equipment: Hand held shower head          Prior Functioning/Environment Level of Independence: Independent                 OT Problem List: Impaired sensation;Pain         OT Goals(Current goals can be found in the care plan section) Acute Rehab OT Goals Patient Stated Goal: home today  OT Frequency:                AM-PAC OT "6 Clicks" Daily Activity     Outcome Measure Help from another person eating meals?: None Help from another person taking care of personal grooming?: None Help from another person toileting, which includes using toliet,  bedpan, or urinal?: None Help from another person bathing (including washing, rinsing, drying)?: None Help from another person to put on and taking off regular upper body clothing?: None Help from another person to put on and taking off regular lower body clothing?: None 6 Click Score: 24   End of Session Equipment Utilized During Treatment: Cervical collar Nurse Communication:  (all OT needs met--communicated on sheet at RN station)  Activity Tolerance: Patient tolerated treatment well Patient left: in bed;with call bell/phone within reach;with family/visitor present  OT Visit Diagnosis: Pain;Other (comment) (numbness in hands) Pain - Right/Left:  (both) Pain - part of body: Shoulder (and right side of neck)                Time: 0802-0829 OT Time Calculation (min): 27 min Charges:  OT General Charges $OT Visit: 1 Visit OT Evaluation $OT Eval Moderate Complexity: 1 Mod OT Treatments $Self Care/Home Management : 8-22 mins  Golden Circle, OTR/L Acute [employer] Pager [phone] Office [phone]     Almon Register 10/06/2019, 9:32 AM

## 2019-10-06 NOTE — Discharge Summary (Signed)
Physician Discharge Summary  Patient ID: Haley Dunn MRN: [id number] DOB/AGE: 1965/06/12 54 y.o.  Admit date: 10/05/2019 Discharge date: 10/06/2019  Admission Diagnoses:  Cervical stenosis with myelopathy  Discharge Diagnoses:  Same Active Problems:   Stenosis of cervical spine with myelopathy [facility])   Discharged Condition: Stable  Hospital Course:  Haley Dunn is a 54 y.o. female with severe cervical stenosis and progressive myelopathy who underwent 4 level ACDF on 10/05/2019.  On postop day #1, her drain was removed and her pain was well controlled with p.o. pain medication.  She was ambulating without difficulty and voiding.  She is tolerating a diet.  She was deemed ready for discharge home.  She will restart her aspirin tomorrow and her Plavix in 1 week.  She will wear cervical collar at all times.  Treatments: Surgery -C3-4, C4-5, C5-6, and C6-7 ACDF  Discharge Exam: Blood pressure (!) 155/71, pulse 78, temperature 98.5 F (36.9 C), temperature source Oral, resp. rate 16, height 5\' 4"  (1.626 m), weight 73.5 kg, SpO2 100 %. Awake, alert, oriented Speech fluent, appropriate CN grossly intact 5/5 BUE/BLE, with 4+/5 hand grip +hyperreflexia Wound c/d/i  Disposition:   Discharge Instructions    Incentive spirometry RT   Complete by: As directed      Allergies as of 10/06/2019      Reactions   Gabapentin Other (See Comments)   Dizziness and falls   Codeine Itching   Latex Rash   Sulfa Antibiotics Rash      Medication List    STOP taking these medications   clopidogrel 75 MG tablet Commonly known as: PLAVIX   diclofenac 75 MG EC tablet Commonly known as: VOLTAREN     TAKE these medications   acetaminophen 500 MG tablet Commonly known as: TYLENOL Take 500 mg by mouth every 6 (six) hours as needed for headache.   acyclovir 400 MG tablet Commonly known as: ZOVIRAX Take 400 mg by mouth 2 (two) times daily as needed (reaction).   ALPRAZolam 1 MG  tablet Commonly known as: XANAX Take 0.5 tablets by mouth 2 (two) times daily as needed for anxiety.   aspirin EC 81 MG tablet Take 81 mg by mouth daily. Notes to patient: 10/07/2019   baclofen 20 MG tablet Commonly known as: LIORESAL Take 20 mg by mouth 3 (three) times daily as needed for muscle spasms.   citalopram 20 MG tablet Commonly known as: CELEXA Take 20 mg by mouth daily.   docusate sodium 100 MG capsule Commonly known as: COLACE Take 1 capsule (100 mg total) by mouth 2 (two) times daily.   furosemide 40 MG tablet Commonly known as: LASIX Take 40 mg by mouth daily as needed for fluid or edema.   Januvia 100 MG tablet Generic drug: sitaGLIPtin Take 100 mg by mouth daily.   losartan 100 MG tablet Commonly known as: COZAAR Take 100 mg by mouth daily.   medroxyPROGESTERone 150 MG/ML injection Commonly known as: DEPO-PROVERA Inject 150 mg into the muscle every 3 (three) months.   metFORMIN 1000 MG tablet Commonly known as: GLUCOPHAGE Take 1,000 mg by mouth 2 (two) times daily.   omeprazole 20 MG capsule Commonly known as: PRILOSEC Take 20 mg by mouth daily as needed (Heartburn).   oxyCODONE-acetaminophen 5-325 MG tablet Commonly known as: PERCOCET/ROXICET Take 1-2 tablets by mouth every 6 (six) hours as needed for moderate pain or severe pain.   pantoprazole 40 MG tablet Commonly known as: PROTONIX Take 40 mg by mouth daily.  pravastatin 40 MG tablet Commonly known as: PRAVACHOL Take 40 mg by mouth daily.   ProAir HFA 108 (90 Base) MCG/ACT inhaler Generic drug: albuterol Inhale 2 puffs into the lungs every 6 (six) hours as needed for wheezing or shortness of breath.   valsartan 160 MG tablet Commonly known as: DIOVAN Take 160 mg by mouth daily.       Follow-up Information    Vallarie Mare, MD Follow up in 2 week(s).   Specialty: Neurosurgery Contact information: [address] [place] [address] [phone]                Signed: Vallarie Mare 10/06/2019, 8:50 AM

## 2019-10-10 ENCOUNTER — Encounter (HOSPITAL_COMMUNITY): Payer: Self-pay | Admitting: Neurosurgery

## 2019-10-12 NOTE — Anesthesia Postprocedure Evaluation (Signed)
Anesthesia Post Note  Patient: Haley Dunn  Procedure(s) Performed: ANTERIOR CERVICAL DECOMPRESSION/DISCECTOMY FUSION  CERVICAL THREE-FOUR- CERVICIAL FOUR-FIVE CERVICAL FIVE-SIX, CERVICAL SIX-SEVEN (N/A )     Patient location during evaluation: PACU Anesthesia Type: General Level of consciousness: awake and alert Pain management: pain level controlled Vital Signs Assessment: post-procedure vital signs reviewed and stable Respiratory status: spontaneous breathing, nonlabored ventilation, respiratory function stable and patient connected to nasal cannula oxygen Cardiovascular status: blood pressure returned to baseline and stable Postop Assessment: no apparent nausea or vomiting Anesthetic complications: no   No complications documented.  Last Vitals:  Vitals:   10/06/19 0325 10/06/19 0723  BP: 135/70 (!) 155/71  Pulse: 67 78  Resp: 18 16  Temp: 36.8 C 36.9 C  SpO2: 97% 100%    Last Pain:  Vitals:   10/06/19 0723  TempSrc: Oral  PainSc:                  Timohty Renbarger

## 2020-06-20 ENCOUNTER — Other Ambulatory Visit: Payer: Self-pay

## 2020-06-20 ENCOUNTER — Encounter: Payer: Self-pay | Admitting: Vascular Surgery

## 2020-06-20 ENCOUNTER — Ambulatory Visit (INDEPENDENT_AMBULATORY_CARE_PROVIDER_SITE_OTHER): Payer: BC Managed Care – PPO | Admitting: Vascular Surgery

## 2020-06-20 ENCOUNTER — Ambulatory Visit (HOSPITAL_COMMUNITY)
Admission: RE | Admit: 2020-06-20 | Discharge: 2020-06-20 | Disposition: A | Discharge status: Home / Self Care | Payer: BC Managed Care – PPO | Source: Ambulatory Visit | Attending: Vascular Surgery | Admitting: Vascular Surgery

## 2020-06-20 VITALS — BP 152/85 | HR 94 | Temp 98.5°F | Resp 20 | Ht 64.0 in | Wt 167.0 lb

## 2020-06-20 DIAGNOSIS — I739 Peripheral vascular disease, unspecified: Secondary | ICD-10-CM
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
## 2020-06-24 ENCOUNTER — Ambulatory Visit (HOSPITAL_COMMUNITY)
Admission: RE | Admit: 2020-06-24 | Discharge: 2020-06-24 | Disposition: A | Discharge status: Home / Self Care | Payer: BC Managed Care – PPO | Source: Ambulatory Visit | Attending: Vascular Surgery | Admitting: Vascular Surgery

## 2020-06-24 ENCOUNTER — Other Ambulatory Visit: Payer: Self-pay

## 2020-06-24 DIAGNOSIS — I739 Peripheral vascular disease, unspecified: Secondary | ICD-10-CM | POA: Diagnosis present

## 2020-06-24 LAB — POCT I-STAT CREATININE: Creatinine, Ser: 0.6 mg/dL (ref 0.44–1.00)

## 2020-06-24 MED ORDER — IOHEXOL 350 MG/ML SOLN
100.0000 mL | Freq: Once | INTRAVENOUS | Status: AC | PRN
  Administered 2020-06-24: 100 mL via INTRAVENOUS

## 2020-07-04 ENCOUNTER — Encounter: Payer: Self-pay | Admitting: Vascular Surgery

## 2020-07-04 ENCOUNTER — Ambulatory Visit (INDEPENDENT_AMBULATORY_CARE_PROVIDER_SITE_OTHER): Payer: BC Managed Care – PPO | Admitting: Vascular Surgery

## 2020-07-04 ENCOUNTER — Other Ambulatory Visit: Payer: Self-pay

## 2020-07-04 DIAGNOSIS — I739 Peripheral vascular disease, unspecified: Secondary | ICD-10-CM | POA: Diagnosis not present
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
## 2020-07-22 IMAGING — CT CT CERVICAL SPINE W/O CM
3 of 4 series · 13 of 33 positions shown, 16 images · non-contrast
Comparison: MRI cervical spine earlier today

CLINICAL DATA: Neck pain.  Numbness in arms and hands.

EXAM:
CT CERVICAL SPINE WITHOUT CONTRAST
TECHNIQUE: Multidetector CT imaging of the cervical spine was performed without
intravenous contrast. Multiplanar CT image reconstructions were also
generated.

[Series 5: sag bone · sagittal · 0.30mm/px · 5 of 61 slices shown, 6 images]
[im 21/61  bone]
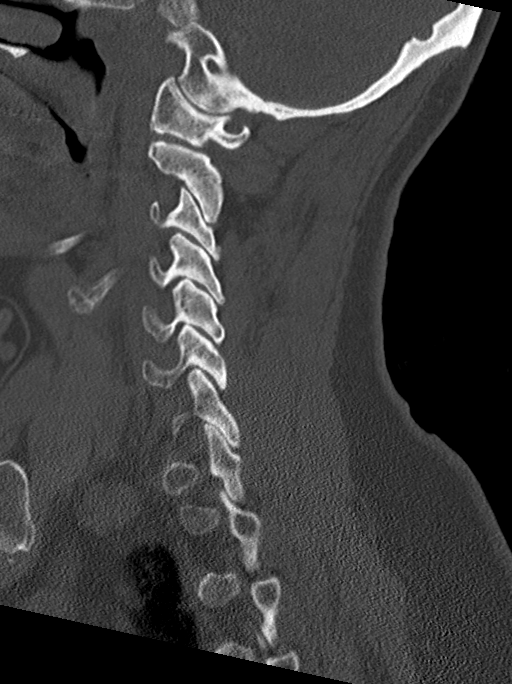
[im 26/61  bone]
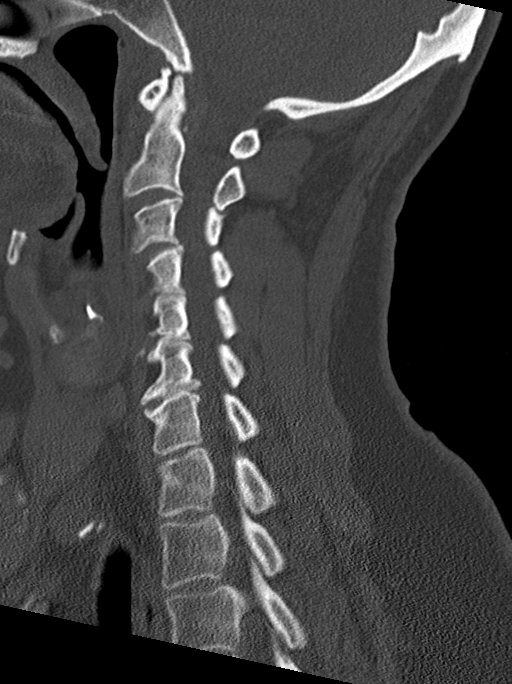
[im 31/61  soft-tissue]
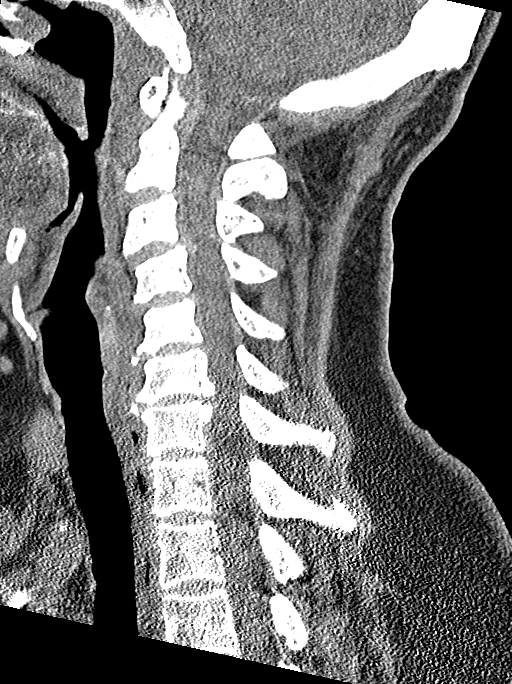
[im 31/61  bone]
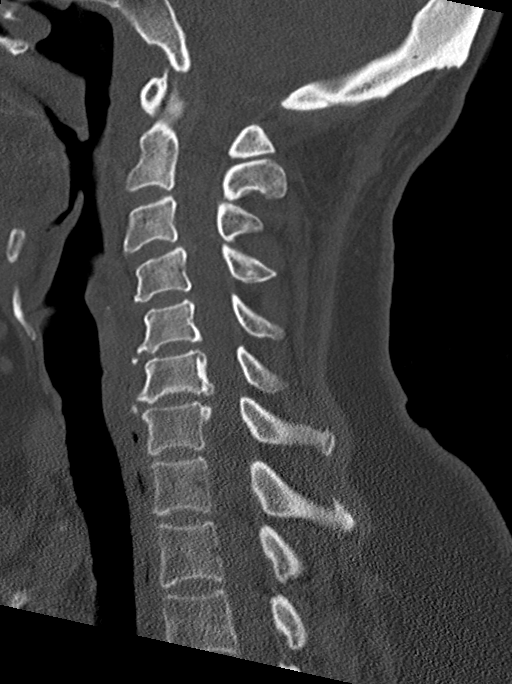
[im 36/61  bone]
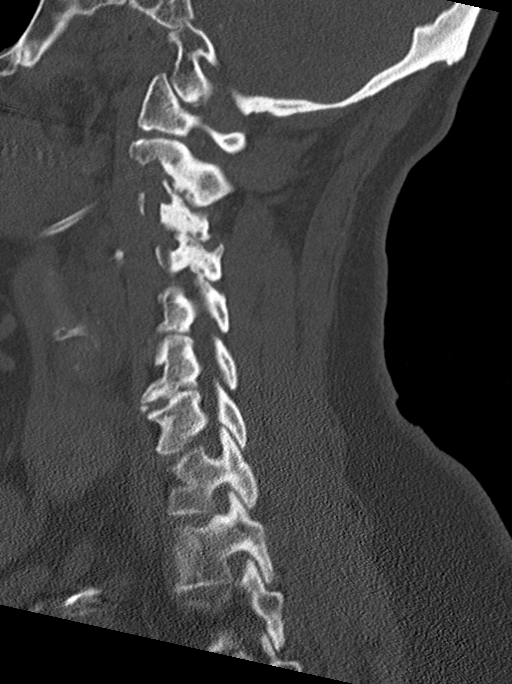
[im 41/61  bone]
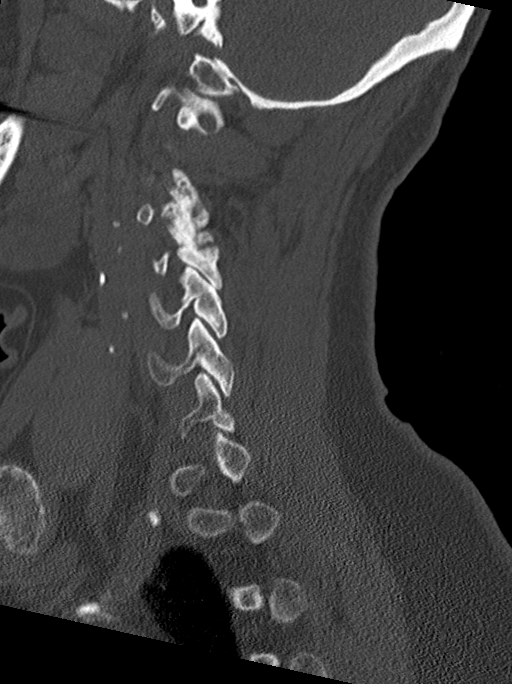

[Series 6: cor bone · coronal · 0.30mm/px · 3 of 60 slices shown]
[im 12/60  bone]
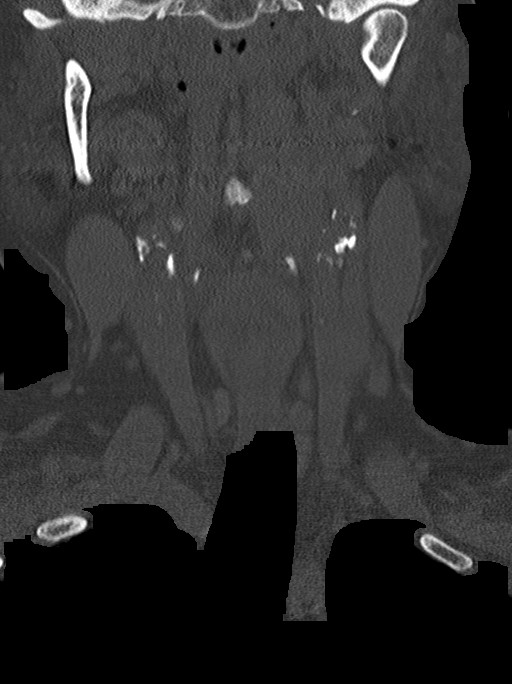
[im 24/60  bone]
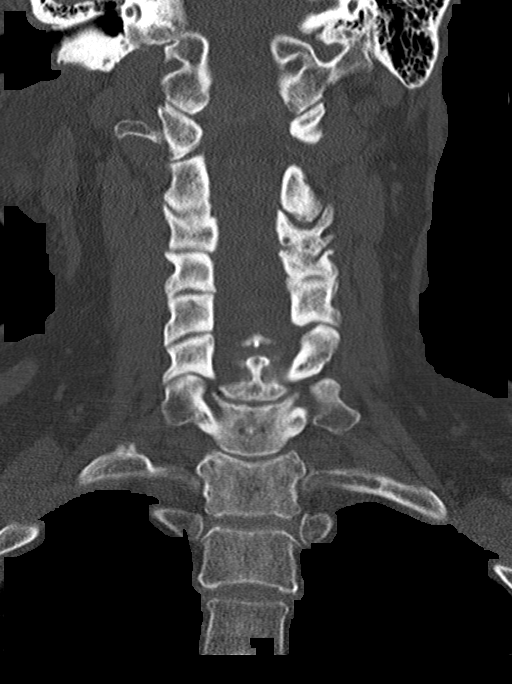
[im 36/60  bone]
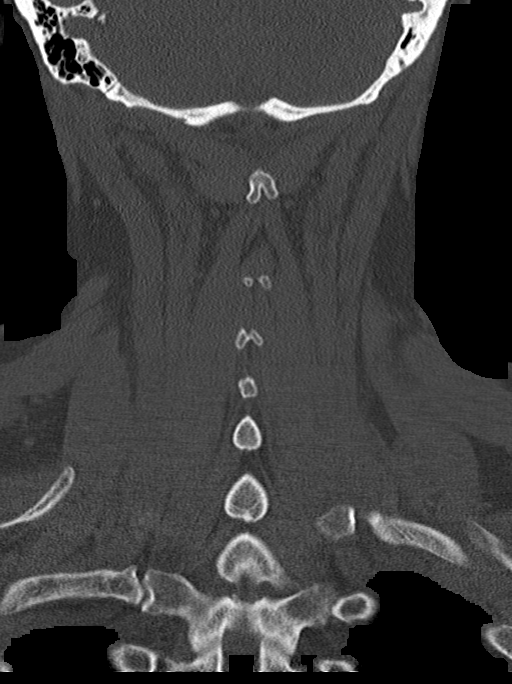

[Series 7: orthogonal axials · axial · 0.21mm/px · z∈[-227,-103]mm · 5 of 99 slices shown, 7 images]
[im 17/99  soft-tissue]
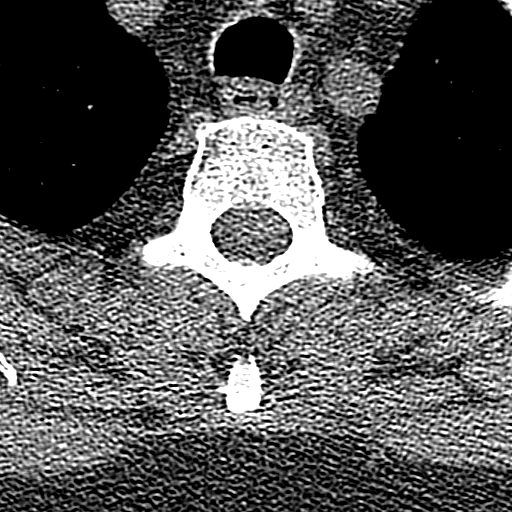
[im 17/99  bone]
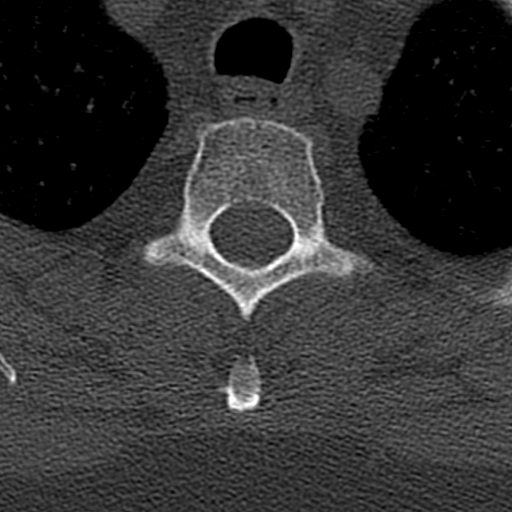
[im 33/99  bone]
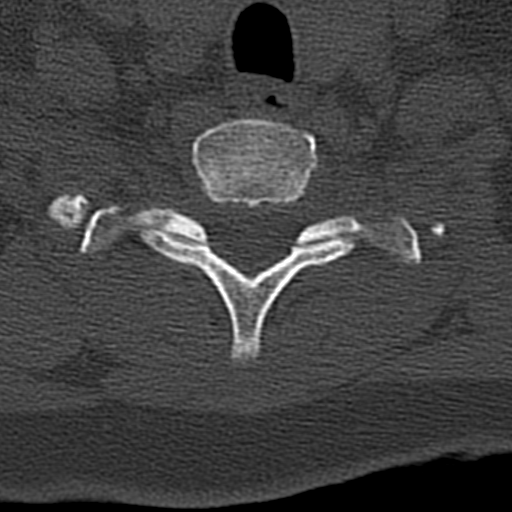
[im 50/99  bone]
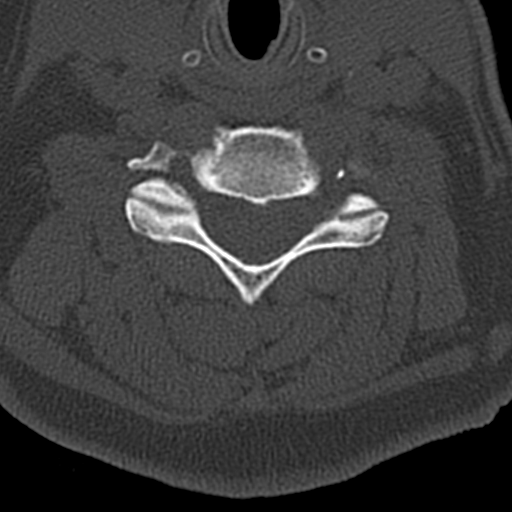
[im 66/99  bone]
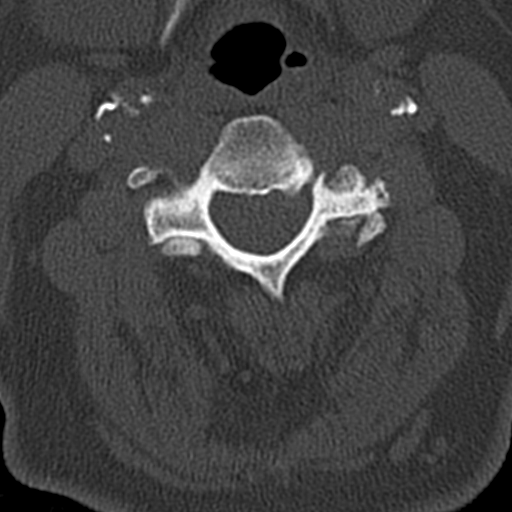
[im 82/99  soft-tissue]
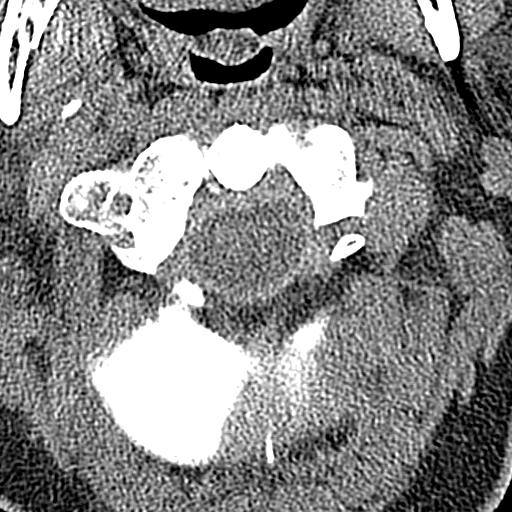
[im 82/99  bone]
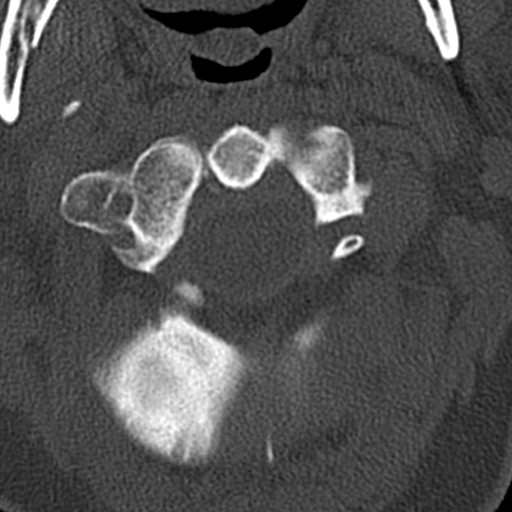

[13 of 33 positions shown; findings below may reference images not displayed]

FINDINGS: Alignment: Mild anterolisthesis C3-4 approximately 1.5 mm. Remaining
alignment normal. Straightening of the cervical lordosis.

Skull base and vertebrae: Negative for fracture or mass.

Soft tissues and spinal canal: Periparotid lymph nodes bilaterally
measuring less than 1 cm. No mass in the neck. Atherosclerotic
calcification at the carotid bifurcation bilaterally.

Disc levels: C2-3: Shallow central disc protrusion. Bilateral facet
hypertrophy. Mild spinal stenosis. Moderate left foraminal
encroachment. Mild right foraminal encroachment.

C3-4: Moderately large central disc protrusion with moderate spinal
stenosis. Prominent uncinate spurring on the right and asymmetric
facet degeneration on the left with severe left foraminal
encroachment. Moderate right foraminal encroachment.

C4-5: Central disc protrusion and bilateral facet hypertrophy. Mild
spinal stenosis. Mild foraminal stenosis bilaterally.

C5-6: Moderately large left paracentral disc protrusion and
associated osteophyte. Cord flattening on the left with spinal
stenosis. Mild foraminal narrowing bilaterally due to spurring

C6-7: Partially calcified central disc protrusion with cord
flattening and moderate spinal stenosis. Diffuse uncinate spurring
with moderate foraminal encroachment bilaterally

C7-T1: Negative for stenosis.

Upper chest: Lung apices clear bilaterally. Atherosclerotic aortic
arch and proximal great vessels.

Other: None
IMPRESSION: 1. Severe multilevel degenerative change in the cervical spine as
described above. Multilevel spinal and foraminal stenosis which is
likely symptomatic.
2. Aortic Atherosclerosis ([id number]). Atherosclerotic
calcification of the carotid bifurcation bilaterally.
3. MRI cervical spine reported separately.

## 2020-07-24 ENCOUNTER — Other Ambulatory Visit: Payer: Self-pay | Admitting: Vascular Surgery

## 2021-01-15 ENCOUNTER — Other Ambulatory Visit: Payer: Self-pay | Admitting: Vascular Surgery

## 2021-05-10 ENCOUNTER — Other Ambulatory Visit: Payer: Self-pay | Admitting: Vascular Surgery

## 2021-06-27 ENCOUNTER — Other Ambulatory Visit: Payer: Self-pay

## 2021-06-27 DIAGNOSIS — I739 Peripheral vascular disease, unspecified: Secondary | ICD-10-CM
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
## 2021-07-04 ENCOUNTER — Ambulatory Visit (HOSPITAL_COMMUNITY)
Admission: RE | Admit: 2021-07-04 | Discharge: 2021-07-04 | Disposition: A | Discharge status: Home / Self Care | Payer: BC Managed Care – PPO | Source: Ambulatory Visit | Attending: Physician Assistant | Admitting: Physician Assistant

## 2021-07-04 ENCOUNTER — Ambulatory Visit (INDEPENDENT_AMBULATORY_CARE_PROVIDER_SITE_OTHER): Payer: BC Managed Care – PPO | Admitting: Physician Assistant

## 2021-07-04 ENCOUNTER — Encounter: Payer: Self-pay | Admitting: Physician Assistant

## 2021-07-04 VITALS — BP 158/91 | HR 91 | Temp 97.2°F | Resp 16 | Ht 64.0 in | Wt 178.0 lb

## 2021-07-04 DIAGNOSIS — I739 Peripheral vascular disease, unspecified: Secondary | ICD-10-CM | POA: Diagnosis present

## 2021-07-16 ENCOUNTER — Other Ambulatory Visit: Payer: Self-pay | Admitting: *Deleted

## 2021-07-16 DIAGNOSIS — I739 Peripheral vascular disease, unspecified: Secondary | ICD-10-CM

## 2021-08-08 ENCOUNTER — Encounter (HOSPITAL_COMMUNITY): Payer: BC Managed Care – PPO

## 2021-08-08 ENCOUNTER — Ambulatory Visit: Payer: BC Managed Care – PPO | Admitting: Vascular Surgery

## 2021-08-21 ENCOUNTER — Other Ambulatory Visit (HOSPITAL_COMMUNITY): Payer: Self-pay | Admitting: Physician Assistant

## 2021-08-21 DIAGNOSIS — I739 Peripheral vascular disease, unspecified: Secondary | ICD-10-CM
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
## 2021-08-22 ENCOUNTER — Ambulatory Visit (INDEPENDENT_AMBULATORY_CARE_PROVIDER_SITE_OTHER)
Admission: RE | Admit: 2021-08-22 | Discharge: 2021-08-22 | Disposition: A | Discharge status: Home / Self Care | Payer: BC Managed Care – PPO | Source: Ambulatory Visit | Attending: Physician Assistant | Admitting: Physician Assistant

## 2021-08-22 ENCOUNTER — Ambulatory Visit: Payer: BC Managed Care – PPO | Admitting: Vascular Surgery

## 2021-08-22 ENCOUNTER — Encounter: Payer: Self-pay | Admitting: Vascular Surgery

## 2021-08-22 ENCOUNTER — Ambulatory Visit (HOSPITAL_COMMUNITY)
Admission: RE | Admit: 2021-08-22 | Discharge: 2021-08-22 | Disposition: A | Discharge status: Home / Self Care | Payer: BC Managed Care – PPO | Source: Ambulatory Visit | Attending: Vascular Surgery | Admitting: Vascular Surgery

## 2021-08-22 ENCOUNTER — Ambulatory Visit (INDEPENDENT_AMBULATORY_CARE_PROVIDER_SITE_OTHER)
Admission: RE | Admit: 2021-08-22 | Discharge: 2021-08-22 | Disposition: A | Discharge status: Home / Self Care | Payer: BC Managed Care – PPO | Source: Ambulatory Visit | Attending: Vascular Surgery | Admitting: Vascular Surgery

## 2021-08-22 VITALS — BP 139/82 | HR 90 | Temp 98.0°F | Resp 20 | Ht 64.0 in | Wt 175.0 lb

## 2021-08-22 DIAGNOSIS — Z95828 Presence of other vascular implants and grafts: Secondary | ICD-10-CM | POA: Diagnosis not present

## 2021-08-22 DIAGNOSIS — I739 Peripheral vascular disease, unspecified: Secondary | ICD-10-CM | POA: Diagnosis present

## 2021-08-28 ENCOUNTER — Other Ambulatory Visit: Payer: Self-pay | Admitting: *Deleted

## 2021-08-28 DIAGNOSIS — I739 Peripheral vascular disease, unspecified: Secondary | ICD-10-CM

## 2021-08-28 DIAGNOSIS — Z95828 Presence of other vascular implants and grafts: Secondary | ICD-10-CM

## 2021-09-10 ENCOUNTER — Other Ambulatory Visit: Payer: Self-pay | Admitting: Vascular Surgery

## 2021-10-20 ENCOUNTER — Other Ambulatory Visit: Payer: Self-pay | Admitting: Internal Medicine

## 2021-10-20 DIAGNOSIS — Z1231 Encounter for screening mammogram for malignant neoplasm of breast: Secondary | ICD-10-CM

## 2021-11-12 ENCOUNTER — Inpatient Hospital Stay: Admission: RE | Admit: 2021-11-12 | Payer: BC Managed Care – PPO | Source: Ambulatory Visit

## 2021-12-27 ENCOUNTER — Other Ambulatory Visit: Payer: Self-pay | Admitting: Vascular Surgery

## 2022-04-11 ENCOUNTER — Other Ambulatory Visit: Payer: Self-pay | Admitting: Vascular Surgery
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
## 2022-06-19 ENCOUNTER — Ambulatory Visit (HOSPITAL_COMMUNITY): Payer: BC Managed Care – PPO

## 2022-06-19 ENCOUNTER — Ambulatory Visit: Payer: BC Managed Care – PPO | Admitting: Vascular Surgery

## 2022-07-27 ENCOUNTER — Ambulatory Visit (HOSPITAL_COMMUNITY): Admission: RE | Admit: 2022-07-27 | Payer: BC Managed Care – PPO | Source: Ambulatory Visit

## 2022-07-27 ENCOUNTER — Ambulatory Visit (HOSPITAL_COMMUNITY): Payer: BC Managed Care – PPO

## 2022-07-31 ENCOUNTER — Ambulatory Visit: Payer: BC Managed Care – PPO | Admitting: Vascular Surgery

## 2022-08-24 ENCOUNTER — Other Ambulatory Visit: Payer: Self-pay | Admitting: Vascular Surgery

## 2022-08-24 DIAGNOSIS — I739 Peripheral vascular disease, unspecified: Secondary | ICD-10-CM

## 2022-08-24 DIAGNOSIS — Z95828 Presence of other vascular implants and grafts: Secondary | ICD-10-CM

## 2022-08-28 ENCOUNTER — Ambulatory Visit (HOSPITAL_COMMUNITY): Admission: RE | Admit: 2022-08-28 | Payer: BC Managed Care – PPO | Source: Ambulatory Visit

## 2022-08-28 ENCOUNTER — Ambulatory Visit: Payer: BC Managed Care – PPO | Admitting: Vascular Surgery

## 2022-08-28 ENCOUNTER — Ambulatory Visit (HOSPITAL_COMMUNITY): Payer: BC Managed Care – PPO

## 2022-12-19 ENCOUNTER — Other Ambulatory Visit: Payer: Self-pay | Admitting: Vascular Surgery

## 2023-03-22 ENCOUNTER — Other Ambulatory Visit: Payer: Self-pay | Admitting: Vascular Surgery

## 2023-03-24 ENCOUNTER — Other Ambulatory Visit: Payer: Self-pay

## 2023-03-24 NOTE — Progress Notes (Signed)
Multiple attempts have been made to contact pt to sch surveillance f/u appt unsuccessfully. Called pharmacy to discuss refill request for Plavix and to inform them of the attempts to sch. Pharmacist asked if our office knew that the pt was taking multiple NSAIDS in addition to the Plavix. When he asked the pt if this office was aware of the combination of medications, she stated that [facility] knew. I informed him that Meloxicam and Diclofenac were not on her med list, so this office wouldn't know that. He is going to tell pt she needs an appt for a refill. Also added those prescriptions to her med list. Confirmed understanding.

## 2023-04-13 ENCOUNTER — Other Ambulatory Visit: Payer: Self-pay | Admitting: Vascular Surgery

## 2023-04-14 ENCOUNTER — Other Ambulatory Visit: Payer: Self-pay

## 2023-04-14 NOTE — Telephone Encounter (Signed)
 Message left for pt requesting a call back to schedule follow-up appts.  Last office visit was 08/22/21 with 1-year follow-up recommended for both imaging and MD visit. Request for medication refill was received - not refilled without MD visit.

## 2023-07-05 ENCOUNTER — Other Ambulatory Visit: Payer: Self-pay | Admitting: *Deleted

## 2023-07-05 DIAGNOSIS — I739 Peripheral vascular disease, unspecified: Secondary | ICD-10-CM

## 2023-07-05 DIAGNOSIS — Z95828 Presence of other vascular implants and grafts: Secondary | ICD-10-CM
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
## 2023-07-15 ENCOUNTER — Encounter: Payer: Self-pay | Admitting: Vascular Surgery

## 2023-07-15 ENCOUNTER — Other Ambulatory Visit: Payer: Self-pay | Admitting: Vascular Surgery

## 2023-07-15 ENCOUNTER — Ambulatory Visit (HOSPITAL_COMMUNITY)
Admission: RE | Admit: 2023-07-15 | Discharge: 2023-07-15 | Disposition: A | Discharge status: Home / Self Care | Payer: BC Managed Care – PPO | Source: Ambulatory Visit | Attending: Vascular Surgery | Admitting: Vascular Surgery

## 2023-07-15 ENCOUNTER — Ambulatory Visit (INDEPENDENT_AMBULATORY_CARE_PROVIDER_SITE_OTHER)
Admission: RE | Admit: 2023-07-15 | Discharge: 2023-07-15 | Disposition: A | Discharge status: Home / Self Care | Payer: BC Managed Care – PPO | Source: Ambulatory Visit | Attending: Vascular Surgery

## 2023-07-15 ENCOUNTER — Ambulatory Visit (INDEPENDENT_AMBULATORY_CARE_PROVIDER_SITE_OTHER): Payer: BC Managed Care – PPO | Admitting: Vascular Surgery

## 2023-07-15 VITALS — BP 149/84 | HR 101 | Temp 98.2°F | Resp 18 | Ht 64.0 in | Wt 178.4 lb

## 2023-07-15 DIAGNOSIS — T82856A Stenosis of peripheral vascular stent, initial encounter: Secondary | ICD-10-CM | POA: Diagnosis not present

## 2023-07-15 DIAGNOSIS — Z95828 Presence of other vascular implants and grafts: Secondary | ICD-10-CM | POA: Diagnosis not present

## 2023-07-15 DIAGNOSIS — I739 Peripheral vascular disease, unspecified: Secondary | ICD-10-CM | POA: Insufficient documentation

## 2023-07-15 LAB — VAS US ABI WITH/WO TBI
Left ABI: 0.51
Right ABI: 0.65

## 2023-07-16 ENCOUNTER — Telehealth: Payer: Self-pay

## 2023-07-16 NOTE — Telephone Encounter (Signed)
 Attempted to call for surgery scheduling. LVM

## 2023-07-19 ENCOUNTER — Other Ambulatory Visit: Payer: Self-pay

## 2023-07-19 ENCOUNTER — Telehealth: Payer: Self-pay

## 2023-07-19 DIAGNOSIS — T82856A Stenosis of peripheral vascular stent, initial encounter: Secondary | ICD-10-CM

## 2023-07-19 MED ORDER — CLOPIDOGREL BISULFATE 75 MG PO TABS: 75.0000 mg | ORAL_TABLET | Freq: Every day | ORAL | 6 refills | Status: DC

## 2023-07-19 NOTE — Telephone Encounter (Signed)
 Patient stated during surgery scheduling call that she forgot to ask Dr. Rosalva Comber about refilling her Plavix.  She has not been taking anything other than aspirin for a few months.  This nurse requested PA authorization for refill.  Refill request submitted to patient's [facility].

## 2023-07-28 ENCOUNTER — Other Ambulatory Visit: Payer: Self-pay

## 2023-07-28 ENCOUNTER — Ambulatory Visit (HOSPITAL_COMMUNITY)
Admission: RE | Admit: 2023-07-28 | Discharge: 2023-07-28 | Disposition: A | Discharge status: Home / Self Care | Attending: Vascular Surgery | Admitting: Vascular Surgery

## 2023-07-28 ENCOUNTER — Encounter (HOSPITAL_COMMUNITY)
Admission: RE | Disposition: A | Discharge status: Home / Self Care | Payer: Self-pay | Source: Home / Self Care | Attending: Vascular Surgery

## 2023-07-28 DIAGNOSIS — I70212 Atherosclerosis of native arteries of extremities with intermittent claudication, left leg: Secondary | ICD-10-CM | POA: Insufficient documentation

## 2023-07-28 DIAGNOSIS — E1140 Type 2 diabetes mellitus with diabetic neuropathy, unspecified: Secondary | ICD-10-CM | POA: Insufficient documentation

## 2023-07-28 DIAGNOSIS — Z79899 Other long term (current) drug therapy: Secondary | ICD-10-CM | POA: Insufficient documentation

## 2023-07-28 DIAGNOSIS — F17210 Nicotine dependence, cigarettes, uncomplicated: Secondary | ICD-10-CM | POA: Diagnosis not present

## 2023-07-28 DIAGNOSIS — T82898A Other specified complication of vascular prosthetic devices, implants and grafts, initial encounter: Secondary | ICD-10-CM | POA: Diagnosis not present

## 2023-07-28 DIAGNOSIS — Z7982 Long term (current) use of aspirin: Secondary | ICD-10-CM | POA: Diagnosis not present

## 2023-07-28 DIAGNOSIS — E1151 Type 2 diabetes mellitus with diabetic peripheral angiopathy without gangrene: Secondary | ICD-10-CM | POA: Diagnosis present

## 2023-07-28 DIAGNOSIS — E785 Hyperlipidemia, unspecified: Secondary | ICD-10-CM | POA: Diagnosis not present

## 2023-07-28 DIAGNOSIS — Z7902 Long term (current) use of antithrombotics/antiplatelets: Secondary | ICD-10-CM | POA: Insufficient documentation

## 2023-07-28 DIAGNOSIS — T82856A Stenosis of peripheral vascular stent, initial encounter: Secondary | ICD-10-CM

## 2023-07-28 DIAGNOSIS — I10 Essential (primary) hypertension: Secondary | ICD-10-CM | POA: Diagnosis not present

## 2023-07-28 HISTORY — PX: ABDOMINAL AORTOGRAM W/LOWER EXTREMITY: CATH118223

## 2023-07-28 HISTORY — PX: LOWER EXTREMITY ANGIOGRAPHY: CATH118251

## 2023-07-28 LAB — POCT I-STAT, CHEM 8
BUN: 13 mg/dL (ref 6–20)
Calcium, Ion: 1.13 mmol/L — ABNORMAL LOW (ref 1.15–1.40)
Chloride: 109 mmol/L (ref 98–111)
Creatinine, Ser: 0.7 mg/dL (ref 0.44–1.00)
Glucose, Bld: 156 mg/dL — ABNORMAL HIGH (ref 70–99)
HCT: 36 % (ref 36.0–46.0)
Hemoglobin: 12.2 g/dL (ref 12.0–15.0)
Potassium: 4.2 mmol/L (ref 3.5–5.1)
Sodium: 139 mmol/L (ref 135–145)
TCO2: 22 mmol/L (ref 22–32)

## 2023-07-28 LAB — GLUCOSE, CAPILLARY: Glucose-Capillary: 164 mg/dL — ABNORMAL HIGH (ref 70–99)

## 2023-07-28 SURGERY — ABDOMINAL AORTOGRAM W/LOWER EXTREMITY
Anesthesia: LOCAL

## 2023-07-28 MED ORDER — FENTANYL CITRATE (PF) 100 MCG/2ML IJ SOLN
INTRAMUSCULAR | Status: DC | PRN
  Administered 2023-07-28: 50 ug via INTRAVENOUS

## 2023-07-28 MED ORDER — FENTANYL CITRATE (PF) 100 MCG/2ML IJ SOLN
INTRAMUSCULAR | Status: AC
  Filled 2023-07-28: qty 2

## 2023-07-28 MED ORDER — MIDAZOLAM HCL 2 MG/2ML IJ SOLN
INTRAMUSCULAR | Status: AC
  Filled 2023-07-28: qty 2

## 2023-07-28 MED ORDER — LIDOCAINE HCL (PF) 1 % IJ SOLN
INTRAMUSCULAR | Status: DC | PRN
  Administered 2023-07-28: 10 mL

## 2023-07-28 MED ORDER — HYDRALAZINE HCL 20 MG/ML IJ SOLN: 5.0000 mg | INTRAMUSCULAR | Status: DC | PRN

## 2023-07-28 MED ORDER — SODIUM CHLORIDE 0.9 % IV SOLN: INTRAVENOUS | Status: DC

## 2023-07-28 MED ORDER — MIDAZOLAM HCL 2 MG/2ML IJ SOLN
INTRAMUSCULAR | Status: DC | PRN
  Administered 2023-07-28: 1 mg via INTRAVENOUS

## 2023-07-28 MED ORDER — SODIUM CHLORIDE 0.9% FLUSH: 3.0000 mL | Freq: Two times a day (BID) | INTRAVENOUS | Status: DC

## 2023-07-28 MED ORDER — SODIUM CHLORIDE 0.9% FLUSH: 3.0000 mL | INTRAVENOUS | Status: DC | PRN

## 2023-07-28 MED ORDER — ACETAMINOPHEN 325 MG PO TABS: 650.0000 mg | ORAL_TABLET | ORAL | Status: DC | PRN

## 2023-07-28 MED ORDER — SODIUM CHLORIDE 0.9 % IV SOLN: 250.0000 mL | INTRAVENOUS | Status: DC | PRN

## 2023-07-28 MED ORDER — IODIXANOL 320 MG/ML IV SOLN
INTRAVENOUS | Status: DC | PRN
  Administered 2023-07-28: 115 mL

## 2023-07-28 MED ORDER — LIDOCAINE HCL (PF) 1 % IJ SOLN
INTRAMUSCULAR | Status: AC
  Filled 2023-07-28: qty 30

## 2023-07-28 MED ORDER — HEPARIN (PORCINE) IN NACL 1000-0.9 UT/500ML-% IV SOLN
INTRAVENOUS | Status: DC | PRN
  Administered 2023-07-28 (×2): 500 mL

## 2023-07-28 MED ORDER — ONDANSETRON HCL 4 MG/2ML IJ SOLN
4.0000 mg | Freq: Four times a day (QID) | INTRAMUSCULAR | Status: DC | PRN
Start: 2023-07-28 — End: 2023-07-28

## 2023-07-28 MED ORDER — SODIUM CHLORIDE 0.9 % WEIGHT BASED INFUSION: 1.0000 mL/kg/h | INTRAVENOUS | Status: DC

## 2023-07-28 MED ORDER — LABETALOL HCL 5 MG/ML IV SOLN: 10.0000 mg | INTRAVENOUS | Status: DC | PRN

## 2023-07-28 SURGICAL SUPPLY — 9 items
CATH STRAIGHT 5FR 65CM (CATHETERS) IMPLANT
COVER DOME SNAP 22 D (MISCELLANEOUS) IMPLANT
DEVICE CLOSURE MYNXGRIP 5F (Vascular Products) IMPLANT
KIT MICROPUNCTURE NIT STIFF (SHEATH) IMPLANT
SET ATX-X65L (MISCELLANEOUS) IMPLANT
SHEATH PINNACLE 5F 10CM (SHEATH) IMPLANT
SHEATH PROBE COVER 6X72 (BAG) IMPLANT
TRAY PV CATH (CUSTOM PROCEDURE TRAY) ×2 IMPLANT
WIRE BENTSON .035X145CM (WIRE) IMPLANT
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
## 2023-07-28 NOTE — Discharge Instructions (Signed)
 Femoral Site Care This sheet gives you information about how to care for yourself after your procedure. Your health care provider may also give you more specific instructions. If you have problems or questions, contact your health care provider. What can I expect after the procedure?  After the procedure, it is common to have: Bruising that usually fades within 1-2 weeks. Tenderness at the site. Follow these instructions at home: Wound care Follow instructions from your health care provider about how to take care of your insertion site. Make sure you: Wash your hands with soap and water before you change your bandage (dressing). If soap and water are not available, use hand sanitizer. Remove your dressing as told by your health care provider. 24 hours Do not take baths, swim, or use a hot tub until your health care provider approves. You may shower 24-48 hours after the procedure or as told by your health care provider. Gently wash the site with plain soap and water. Pat the area dry with a clean towel. Do not rub the site. This may cause bleeding. Do not apply powder or lotion to the site. Keep the site clean and dry. Check your femoral site every day for signs of infection. Check for: Redness, swelling, or pain. Fluid or blood. Warmth. Pus or a bad smell. Activity For the first 2-3 days after your procedure, or as long as directed: Avoid climbing stairs as much as possible. Do not squat. Do not lift anything that is heavier than 10 lb (4.5 kg), or the limit that you are told, until your health care provider says that it is safe. For 5 days Rest as directed. Avoid sitting for a long time without moving. Get up to take short walks every 1-2 hours. Do not drive for 24 hours if you were given a medicine to help you relax (sedative). General instructions Take over-the-counter and prescription medicines only as told by your health care provider. Keep all follow-up visits as told by your  health care provider. This is important. Contact a health care provider if you have: A fever or chills. You have redness, swelling, or pain around your insertion site. Get help right away if: The catheter insertion area swells very fast. You Lwin out. You suddenly start to sweat or your skin gets clammy. The catheter insertion area is bleeding, and the bleeding does not stop when you hold steady pressure on the area. The area near or just beyond the catheter insertion site becomes pale, cool, tingly, or numb. These symptoms may represent a serious problem that is an emergency. Do not wait to see if the symptoms will go away. Get medical help right away. Call your local emergency services (911 in the [place] Do not drive yourself to the hospital. Summary After the procedure, it is common to have bruising that usually fades within 1-2 weeks. Check your femoral site every day for signs of infection. Do not lift anything that is heavier than 10 lb (4.5 kg), or the limit that you are told, until your health care provider says that it is safe. This information is not intended to replace advice given to you by your health care provider. Make sure you discuss any questions you have with your health care provider. Document Revised: 04/05/2017 Document Reviewed: 04/05/2017 Elsevier Patient Education  2020 [employer].

## 2023-07-28 NOTE — Op Note (Addendum)
 Patient name: Haley Dunn MRN: [id number] DOB: 06/01/1965 Sex: female  07/28/2023 Pre-operative Diagnosis: Elevated velocities within common iliac stent with concern for threatened left direct femoral-femoral bypass Post-operative diagnosis:  Same Surgeon:  Kayla Part, MD Procedure Performed: 1.  Ultrasound-guided micropuncture access of the left common femoral artery in retrograde fashion 2.  Aortogram 3.  Selective imaging of the left common iliac artery stent, pullback pressure gradient 0 4.  Bilateral lower extremity angiogram including left to right femoral-femoral bypass 5.  Duplex assisted closure-Mynx 6.  Moderate sedation time 36 minutes, contrast volume 115 mL   Indications: 58 y.o. female here for follow up for PAD. She has previously had a left common iliac stent and left to right femoral-femoral bypass by Dr. Nolene Dunn in June of 2016. This was performed due to lifestyle limiting claudication. On exam today, symptoms have worsened.  She has bilateral lower extremity claudication, left greater than right.  She continues to have pulses in the right dorsalis pedis, so there is likely an amount of left-sided SFA popliteal disease.  Regardless, studies demonstrate elevated velocities in the left CIA stent, as well as inflow into the femoral-femoral graft.  I have discussed the risks and benefits of diagnostic angiography with possible intervention for threatened femoral-femoral bypass graft, Haley Dunn elected to proceed.  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  Occluded right common iliac artery stent.  Widely patent left common iliac artery stent.  No flow-limiting stenosis in the aortoiliac segment bilaterally.  On the LEFT: Widely patent common femoral artery, profunda.  The superficial femoral artery is patent, but has focal stenotic disease at the mid SFA from eccentric plaque.  Popliteal artery widely patent.  Patient appears to  have three-vessel runoff to the mid tibia.  Distal runoff difficult to assess due to contrast filling and movement.  Appears to have two-vessel runoff dorsalis pedis and posterior tibial artery at the level of the ankle extending into the foot.  Femoral-femoral bypass widely patent without flow-limiting stenosis.  On the RIGHT: Widely patent common femoral artery, profunda.  The superficial femoral artery is patent with no flow-limiting stenosis.  Popliteal artery is widely patent.  Patient appears to have three-vessel runoff to the level of the mid tibia.  Appears to have two-vessel runoff dorsalis pedis and posterior tibial artery to the level of the ankle extending into the foot.   Procedure:  The patient was identified in the holding area and taken to room 8.  The patient was then placed supine on the table and prepped and draped in the usual sterile fashion.  A time out was called.  Ultrasound was used to evaluate the left common femoral artery.  It was patent .  A digital ultrasound image was acquired.  A micropuncture needle was used to access the left common femoral artery under ultrasound guidance.  An 018 wire was advanced without resistance and a micropuncture sheath was placed.  The 018 wire was removed and a benson wire was placed.  The micropuncture sheath was exchanged for a 5 french sheath.  A straight catheter was placed in the infrarenal abdominal aorta, and imaging followed.  See results above.  A pullback pressure was obtained across the left common iliac artery stent demonstrating no gradient.  Bilateral lower extremity runoff followed.  Impression: No flow-limiting stenosis and to the left sided common iliac artery stent.  No flow-limiting stenosis identified in the left common femoral artery or femoral-femoral bypass  graft.  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  No plan for intervention at this time.  Numbness is appreciated when sitting, and standing, therefore there could be a  musculoskeletal component as well.  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 Patient is aware that lower extremity atherosclerotic disease will progress if she continues to smoke 1 pack/day.    Kayla Part MD [facility] [place] Office: [phone]

## 2023-07-28 NOTE — Progress Notes (Signed)
Up and walked and tolerated well; left groin stable, no bleeding or hematoma 

## 2023-07-29 ENCOUNTER — Encounter (HOSPITAL_COMMUNITY): Payer: Self-pay | Admitting: Vascular Surgery

## 2023-08-02 ENCOUNTER — Telehealth: Payer: Self-pay

## 2023-08-02 NOTE — Telephone Encounter (Signed)
 Referral:   -pt called about the referral for orthopedic surgery referral for neurogenic claudication per her discussion with Dr. Rosalva Comber.   -MD messaged regarding his recommendation

## 2023-08-13 ENCOUNTER — Telehealth: Payer: Self-pay | Admitting: Radiology

## 2023-08-13 NOTE — Telephone Encounter (Signed)
 Please see message from [place] office. OK for me to print for patient?  Pt wants a copy of her records from Dr Murrel Arnt. She is going to see [facility].  She will come by Wednesday afternoon to get them

## 2023-08-16 NOTE — Telephone Encounter (Signed)
 Yes. Please have patient to sign authorization when she picks up. Thank you!

## 2023-08-18 NOTE — Telephone Encounter (Signed)
 Notes printed.  Haley Dunn to get [place] signed when patient comes in.

## 2023-09-27 ENCOUNTER — Telehealth: Payer: Self-pay

## 2023-09-27 NOTE — Telephone Encounter (Signed)
 [facility] called asking to hold pt's Plavix  for 7 days for upcoming surgery.   See communication below.  This was communicated to [facility] via Voice Mail to [phone].  RE: Plavix  hold Received: Today Robins, Joshua E, MD  Addie Lolita DEL, RN Yes would like her to stay on ASA if possible.       Previous Messages    ----- Message ----- From: Addie Lolita DEL, RN Sent: 09/27/2023  10:29 AM EDT To: Fonda FORBES Rim, MD Subject: Plavix  hold                                    Hi Dr. Rim,  [facility] called asking if pt's plavix  can be held for 7 days.  Thanks, Lolita

## 2023-12-15 ENCOUNTER — Telehealth: Payer: Self-pay | Admitting: *Deleted

## 2023-12-15 NOTE — Telephone Encounter (Signed)
   Pre-operative Risk Assessment    Patient Name: Haley Dunn  DOB: 1965/08/08 MRN: [id number]   Date of last office visit: 09/10/14 DR. KONESWARAN Date of next office visit: 12/31/23 DR. MALLIPEDDI   Request for Surgical Clearance    Procedure:  LUMBAR FUSION  Date of Surgery:  Clearance TBD                                Surgeon:  DR. DORN NED  Surgeon's Group or Practice Name:  [facility] Phone number:  [phone] Fax number:  [phone] NIKKI   Type of Clearance Requested:   - Medical  - Pharmacy:  Hold Aspirin  and Clopidogrel  (Plavix )     Type of Anesthesia:  General    Additional requests/questions:    Bonney Niels Jest   12/15/2023, 3:53 PM

## 2023-12-24 ENCOUNTER — Other Ambulatory Visit: Payer: Self-pay

## 2023-12-24 DIAGNOSIS — Z95828 Presence of other vascular implants and grafts: Secondary | ICD-10-CM

## 2023-12-24 DIAGNOSIS — I739 Peripheral vascular disease, unspecified: Secondary | ICD-10-CM

## 2023-12-31 ENCOUNTER — Encounter: Payer: Self-pay | Admitting: Internal Medicine

## 2023-12-31 ENCOUNTER — Ambulatory Visit: Attending: Internal Medicine | Admitting: Internal Medicine

## 2023-12-31 ENCOUNTER — Telehealth: Payer: Self-pay | Admitting: Internal Medicine

## 2023-12-31 VITALS — BP 162/72 | HR 85 | Ht 64.0 in | Wt 187.4 lb

## 2023-12-31 DIAGNOSIS — Z01810 Encounter for preprocedural cardiovascular examination: Secondary | ICD-10-CM | POA: Insufficient documentation

## 2023-12-31 DIAGNOSIS — I739 Peripheral vascular disease, unspecified: Secondary | ICD-10-CM | POA: Diagnosis present

## 2023-12-31 DIAGNOSIS — Z01818 Encounter for other preprocedural examination: Secondary | ICD-10-CM | POA: Diagnosis present

## 2023-12-31 DIAGNOSIS — E7849 Other hyperlipidemia: Secondary | ICD-10-CM | POA: Diagnosis present

## 2023-12-31 DIAGNOSIS — E785 Hyperlipidemia, unspecified: Secondary | ICD-10-CM | POA: Insufficient documentation

## 2023-12-31 NOTE — Patient Instructions (Addendum)
 Medication Instructions:  Your physician recommends that you continue on your current medications as directed. Please refer to the Current Medication list given to you today.   Labwork: None  Testing/Procedures: Your physician has requested that you have a lexiscan myoview. For further information please visit [email or web address] Please follow instruction sheet, as given.   Follow-Up: Your physician recommends that you schedule a follow-up appointment in: Pending Results  Any Other Special Instructions Will Be Listed Below (If Applicable). Thank you for choosing [facility]!     If you need a refill on your cardiac medications before your next appointment, please call your pharmacy.
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
## 2023-12-31 NOTE — Telephone Encounter (Signed)
 Checking percert on the following patient for testing scheduled at [facility].    LEXISCAN     01-05-2024

## 2024-01-05 ENCOUNTER — Encounter (HOSPITAL_COMMUNITY): Payer: Self-pay

## 2024-01-05 ENCOUNTER — Ambulatory Visit (HOSPITAL_COMMUNITY)
Admission: RE | Admit: 2024-01-05 | Discharge: 2024-01-05 | Disposition: A | Discharge status: Home / Self Care | Source: Ambulatory Visit | Attending: Internal Medicine | Admitting: Internal Medicine

## 2024-01-05 ENCOUNTER — Telehealth: Payer: Self-pay | Admitting: Student

## 2024-01-05 ENCOUNTER — Encounter (HOSPITAL_COMMUNITY)
Admission: RE | Admit: 2024-01-05 | Discharge: 2024-01-05 | Disposition: A | Discharge status: Home / Self Care | Source: Ambulatory Visit | Attending: Internal Medicine | Admitting: Internal Medicine

## 2024-01-05 DIAGNOSIS — Z01818 Encounter for other preprocedural examination: Secondary | ICD-10-CM | POA: Insufficient documentation

## 2024-01-05 DIAGNOSIS — I739 Peripheral vascular disease, unspecified: Secondary | ICD-10-CM | POA: Diagnosis not present

## 2024-01-05 LAB — NM MYOCAR MULTI W/SPECT W/WALL MOTION / EF
Base ST Depression (mm): 0 mm
Estimated workload: 1
Exercise duration (min): 5 min
Exercise duration (sec): 7 s
LV dias vol: 81 mL (ref 46–106)
LV sys vol: 32 mL (ref 3.8–5.2)
MPHR: 162 {beats}/min
Nuc Stress EF: 61 %
Peak HR: 101 {beats}/min
Percent HR: 62 %
RATE: 0.4
RPE: 1
Rest HR: 76 {beats}/min
Rest Nuclear Isotope Dose: 11 mCi
SDS: 8
SRS: 2
SSS: 10
ST Depression (mm): 0 mm
Stress Nuclear Isotope Dose: 31 mCi
TID: 1.38

## 2024-01-05 MED ORDER — REGADENOSON 0.4 MG/5ML IV SOLN
INTRAVENOUS | Status: AC
  Administered 2024-01-05: 0.4 mg via INTRAVENOUS
  Filled 2024-01-05: qty 5

## 2024-01-05 MED ORDER — TECHNETIUM TC 99M TETROFOSMIN IV KIT
30.0000 | PACK | Freq: Once | INTRAVENOUS | Status: AC | PRN
  Administered 2024-01-05: 31 via INTRAVENOUS

## 2024-01-05 MED ORDER — SODIUM CHLORIDE FLUSH 0.9 % IV SOLN
INTRAVENOUS | Status: AC
  Administered 2024-01-05: 10 mL via INTRAVENOUS
  Filled 2024-01-05: qty 10

## 2024-01-05 MED ORDER — TECHNETIUM TC 99M TETROFOSMIN IV KIT
10.0000 | PACK | Freq: Once | INTRAVENOUS | Status: AC | PRN
  Administered 2024-01-05: 11 via INTRAVENOUS

## 2024-01-05 NOTE — Telephone Encounter (Signed)
     Haley Dunn presented for a Lexiscan  nuclear stress test today.  I Laymon CHRISTELLA Qua, PA-C, provided direct supervision and was present during the stress portion of the study today, which was completed without significant symptoms, immediate complications, or acute ST/T changes on ECG.  Stress imaging is pending at this time.  Preliminary ECG findings may be listed in the chart, but the stress test result will not be finalized until perfusion imaging is complete.  Laymon CHRISTELLA Qua, PA-C  01/05/2024, 10:51 AM

## 2024-01-13 ENCOUNTER — Other Ambulatory Visit: Payer: Self-pay | Admitting: Neurosurgery

## 2024-01-14 ENCOUNTER — Ambulatory Visit: Payer: Self-pay | Admitting: Internal Medicine

## 2024-01-25 NOTE — Progress Notes (Signed)
 Surgical Instructions   Your procedure is scheduled on February 01, 2024. Report to [facility] Main Entrance A at 5:30 A.M., then check in with the Admitting office. Any questions or running late day of surgery: call [phone]  Questions prior to your surgery date: call [phone], Monday-Friday, 8am-4pm. If you experience any cold or flu symptoms such as cough, fever, chills, shortness of breath, etc. between now and your scheduled surgery, please notify us  at the above number.     Remember:  Do not eat or drink after midnight the night before your surgery. No gum, mints, or hard candy.  Take these medicines the morning of surgery with A SIP OF WATER:  citalopram  (CELEXA )  pravastatin  (PRAVACHOL )    May take these medicines IF NEEDED: acetaminophen  (TYLENOL )  acyclovir  (ZOVIRAX )  ALPRAZolam  (XANAX )  baclofen  (LIORESAL )  pantoprazole  (PROTONIX )  albuterol  (VENTOLIN  HFA) 108 (90 Base) MCG/ACT inhaler  - bring inhaler to the hospital with you  Follow your surgeon's instructions regarding stopping Aspirin  and Plavix  prior to surgery. If no instructions were given, please contact your surgeon's office.   One week prior to surgery, STOP taking any  Aleve, Naproxen, Ibuprofen, Motrin, Advil, Goody's, BC's, all herbal medications, fish oil, and non-prescription vitamins, including meloxicam (mobic)  WHAT DO I DO ABOUT MY DIABETES MEDICATION?   Do not take oral diabetes medicines (pills) the morning of surgery. DO NOT take glipiZIDE  (GLUCOTROL  XL), JANUVIA, metFORMIN  (GLUCOPHAGE ) the day of surgery.     HOW TO MANAGE YOUR DIABETES BEFORE AND AFTER SURGERY  Why is it important to control my blood sugar before and after surgery? Improving blood sugar levels before and after surgery helps healing and can limit problems. A way of improving blood sugar control is eating a healthy diet by:  Eating less sugar and carbohydrates  Increasing activity/exercise  Talking with your  doctor about reaching your blood sugar goals High blood sugars (greater than 180 mg/dL) can raise your risk of infections and slow your recovery, so you will need to focus on controlling your diabetes during the weeks before surgery. Make sure that the doctor who takes care of your diabetes knows about your planned surgery including the date and location.  How do I manage my blood sugar before surgery? Check your blood sugar at least 4 times a day, starting 2 days before surgery, to make sure that the level is not too high or low.  Check your blood sugar the morning of your surgery when you wake up and every 2 hours until you get to the Short Stay unit.  If your blood sugar is less than 70 mg/dL, you will need to treat for low blood sugar: Do not take insulin . Treat a low blood sugar (less than 70 mg/dL) with  cup of clear juice (cranberry or apple), 4 glucose tablets, OR glucose gel. Recheck blood sugar in 15 minutes after treatment (to make sure it is greater than 70 mg/dL). If your blood sugar is not greater than 70 mg/dL on recheck, call [phone] for further instructions. Report your blood sugar to the short stay nurse when you get to Short Stay.  If you are admitted to the hospital after surgery: Your blood sugar will be checked by the staff and you will probably be given insulin  after surgery (instead of oral diabetes medicines) to make sure you have good blood sugar levels. The goal for blood sugar control after surgery is 80-180 mg/dL.  Do NOT Smoke (Tobacco/Vaping) for 24 hours prior to your procedure.  If you use a CPAP at night, you may bring your mask/headgear for your overnight stay.   You will be asked to remove any contacts, glasses, piercing's, hearing aid's, dentures/partials prior to surgery. Please bring cases for these items if needed.    Patients discharged the day of surgery will not be allowed to drive home, and someone needs to stay with  them for 24 hours.  SURGICAL WAITING ROOM VISITATION Patients may have no more than 2 support people in the waiting area - these visitors may rotate.   Pre-op nurse will coordinate an appropriate time for 1 ADULT support person, who may not rotate, to accompany patient in pre-op.  Children under the age of 65 must have an adult with them who is not the patient and must remain in the main waiting area with an adult.  If the patient needs to stay at the hospital during part of their recovery, the visitor guidelines for inpatient rooms apply.  Please refer to the [facility] website for the visitor guidelines for any additional information.   If you received a COVID test during your pre-op visit  it is requested that you wear a mask when out in public, stay away from anyone that may not be feeling well and notify your surgeon if you develop symptoms. If you have been in contact with anyone that has tested positive in the last 10 days please notify you surgeon.      Pre-operative 4 CHG Bathing Instructions   You can play a key role in reducing the risk of infection after surgery. Your skin needs to be as free of germs as possible. You can reduce the number of germs on your skin by washing with CHG (chlorhexidine  gluconate) soap before surgery. CHG is an antiseptic soap that kills germs and continues to kill germs even after washing.   DO NOT use if you have an allergy to chlorhexidine /CHG or antibacterial soaps. If your skin becomes reddened or irritated, stop using the CHG and notify one of our RNs at [phone].   Please shower with the CHG soap starting 4 days before surgery using the following schedule:     Please keep in mind the following:  DO NOT shave, including legs and underarms, starting the day of your first shower.   You may shave your face at any point before/day of surgery.  Place clean sheets on your bed the day you start using CHG soap. Use a clean washcloth (not used  since being washed) for each shower. DO NOT sleep with pets once you start using the CHG.   CHG Shower Instructions:  Wash your face and private area with normal soap. If you choose to wash your hair, wash first with your normal shampoo.  After you use shampoo/soap, rinse your hair and body thoroughly to remove shampoo/soap residue.  Turn the water OFF and apply  bottle of CHG soap to a CLEAN washcloth.  Apply CHG soap ONLY FROM YOUR NECK DOWN TO YOUR TOES (washing for 3-5 minutes)  DO NOT use CHG soap on face, private areas, open wounds, or sores.  Pay special attention to the area where your surgery is being performed.  If you are having back surgery, having someone wash your back for you may be helpful. Wait 2 minutes after CHG soap is applied, then you may rinse off the CHG soap.  Pat dry with a clean towel  Put on clean clothes/pajamas   If you choose to wear lotion, please use ONLY the CHG-compatible lotions that are listed below.  Additional instructions for the day of surgery:  If you choose, you may shower the morning of surgery with an antibacterial soap.  DO NOT APPLY any lotions, deodorants, cologne, or perfumes.   Do not bring valuables to the hospital. [facility] is not responsible for any belongings/valuables. Do not wear nail polish, gel polish, artificial nails, or any other type of covering on natural nails (fingers and toes) Do not wear jewelry or makeup Put on clean/comfortable clothes.  Please brush your teeth.  Ask your nurse before applying any prescription medications to the skin.     CHG Compatible Lotions   Aveeno Moisturizing lotion  Cetaphil Moisturizing Cream  Cetaphil Moisturizing Lotion  Clairol Herbal Essence Moisturizing Lotion, Dry Skin  Clairol Herbal Essence Moisturizing Lotion, Extra Dry Skin  Clairol Herbal Essence Moisturizing Lotion, Normal Skin  Curel Age Defying Therapeutic Moisturizing Lotion with Alpha Hydroxy  Curel Extreme Care  Body Lotion  Curel Soothing Hands Moisturizing Hand Lotion  Curel Therapeutic Moisturizing Cream, Fragrance-Free  Curel Therapeutic Moisturizing Lotion, Fragrance-Free  Curel Therapeutic Moisturizing Lotion, Original Formula  Eucerin Daily Replenishing Lotion  Eucerin Dry Skin Therapy Plus Alpha Hydroxy Crme  Eucerin Dry Skin Therapy Plus Alpha Hydroxy Lotion  Eucerin Original Crme  Eucerin Original Lotion  Eucerin Plus Crme Eucerin Plus Lotion  Eucerin TriLipid Replenishing Lotion  Keri Anti-Bacterial Hand Lotion  Keri Deep Conditioning Original Lotion Dry Skin Formula Softly Scented  Keri Deep Conditioning Original Lotion, Fragrance Free Sensitive Skin Formula  Keri Lotion Fast Absorbing Fragrance Free Sensitive Skin Formula  Keri Lotion Fast Absorbing Softly Scented Dry Skin Formula  Keri Original Lotion  Keri Skin Renewal Lotion Keri Silky Smooth Lotion  Keri Silky Smooth Sensitive Skin Lotion  Nivea Body Creamy Conditioning Oil  Nivea Body Extra Enriched Lotion  Nivea Body Original Lotion  Nivea Body Sheer Moisturizing Lotion Nivea Crme  Nivea Skin Firming Lotion  NutraDerm 30 Skin Lotion  NutraDerm Skin Lotion  NutraDerm Therapeutic Skin Cream  NutraDerm Therapeutic Skin Lotion  ProShield Protective Hand Cream  Provon moisturizing lotion  Please read over the following fact sheets that you were given.

## 2024-01-26 ENCOUNTER — Encounter (HOSPITAL_COMMUNITY)
Admission: RE | Admit: 2024-01-26 | Discharge: 2024-01-26 | Disposition: A | Discharge status: Home / Self Care | Source: Ambulatory Visit | Attending: Neurosurgery | Admitting: Neurosurgery

## 2024-01-26 ENCOUNTER — Other Ambulatory Visit: Payer: Self-pay

## 2024-01-26 ENCOUNTER — Encounter (HOSPITAL_COMMUNITY): Payer: Self-pay

## 2024-01-26 VITALS — BP 139/67 | HR 100 | Temp 98.2°F | Resp 16 | Ht 64.0 in | Wt 185.7 lb

## 2024-01-26 DIAGNOSIS — E785 Hyperlipidemia, unspecified: Secondary | ICD-10-CM | POA: Insufficient documentation

## 2024-01-26 DIAGNOSIS — M5116 Intervertebral disc disorders with radiculopathy, lumbar region: Secondary | ICD-10-CM | POA: Insufficient documentation

## 2024-01-26 DIAGNOSIS — E119 Type 2 diabetes mellitus without complications: Secondary | ICD-10-CM

## 2024-01-26 DIAGNOSIS — K589 Irritable bowel syndrome without diarrhea: Secondary | ICD-10-CM | POA: Diagnosis not present

## 2024-01-26 DIAGNOSIS — K219 Gastro-esophageal reflux disease without esophagitis: Secondary | ICD-10-CM | POA: Diagnosis not present

## 2024-01-26 DIAGNOSIS — I10 Essential (primary) hypertension: Secondary | ICD-10-CM | POA: Insufficient documentation

## 2024-01-26 DIAGNOSIS — E1151 Type 2 diabetes mellitus with diabetic peripheral angiopathy without gangrene: Secondary | ICD-10-CM | POA: Diagnosis not present

## 2024-01-26 DIAGNOSIS — J45909 Unspecified asthma, uncomplicated: Secondary | ICD-10-CM | POA: Diagnosis not present

## 2024-01-26 DIAGNOSIS — Z01812 Encounter for preprocedural laboratory examination: Secondary | ICD-10-CM | POA: Insufficient documentation

## 2024-01-26 DIAGNOSIS — Z87891 Personal history of nicotine dependence: Secondary | ICD-10-CM | POA: Insufficient documentation

## 2024-01-26 DIAGNOSIS — M1990 Unspecified osteoarthritis, unspecified site: Secondary | ICD-10-CM | POA: Insufficient documentation

## 2024-01-26 DIAGNOSIS — Z01818 Encounter for other preprocedural examination: Secondary | ICD-10-CM

## 2024-01-26 LAB — TYPE AND SCREEN
ABO/RH(D): O POS
Antibody Screen: NEGATIVE

## 2024-01-26 LAB — BASIC METABOLIC PANEL WITH GFR
Anion gap: 5 (ref 5–15)
BUN: 14 mg/dL (ref 6–20)
CO2: 23 mmol/L (ref 22–32)
Calcium: 8.7 mg/dL — ABNORMAL LOW (ref 8.9–10.3)
Chloride: 105 mmol/L (ref 98–111)
Creatinine, Ser: 0.76 mg/dL (ref 0.44–1.00)
GFR, Estimated: >60 mL/min (ref >60)
Glucose, Bld: 209 mg/dL — ABNORMAL HIGH (ref 70–99)
Potassium: 4.1 mmol/L (ref 3.5–5.1)
Sodium: 133 mmol/L — ABNORMAL LOW (ref 135–145)

## 2024-01-26 LAB — CBC
HCT: 37.1 % (ref 36.0–46.0)
Hemoglobin: 12.2 g/dL (ref 12.0–15.0)
MCH: 33.2 pg (ref 26.0–34.0)
MCHC: 32.9 g/dL (ref 30.0–36.0)
MCV: 100.8 fL — ABNORMAL HIGH (ref 80.0–100.0)
Platelets: 258 K/uL (ref 150–400)
RBC: 3.68 MIL/uL — ABNORMAL LOW (ref 3.87–5.11)
RDW: 13.6 % (ref 11.5–15.5)
WBC: 8.1 K/uL (ref 4.0–10.5)
nRBC: 0 % (ref 0.0–0.2)

## 2024-01-26 LAB — GLUCOSE, CAPILLARY: Glucose-Capillary: 255 mg/dL — ABNORMAL HIGH (ref 70–99)

## 2024-01-26 LAB — SURGICAL PCR SCREEN
MRSA, PCR: NEGATIVE
Staphylococcus aureus: NEGATIVE

## 2024-01-26 NOTE — Progress Notes (Addendum)
 PCP - Dr Leta Fear Cardiologist - Mallipeddi, Diannah SQUIBB, MD   PPM/ICD - denies   Chest x-ray - N/A EKG - 12/31/23 Stress Test - 01/05/24 ECHO - denies Cardiac Cath - denies  Sleep Study - denies   Fasting Blood Sugar - Pt states she has an old meter that doesn't work. She states she only gets her A1c checked at [facility]. Last A1c was last week at PCP- 6.7 per patient. Records requested.   Last dose of GLP1 agonist-  N/A   Blood Thinner Instructions: Last dose of ASA and Plavix  01/24/24 per pt.    ERAS Protcol - NPO order   COVID TEST- N/A   Anesthesia review: yes, cardiac clearance in epic. Requested last office note from PCP and last A1c result. When I called the office for a fax number, I was directed to a specific number for medical records- I left a voicemail to have this information faxed over.   Patient denies shortness of breath, fever, cough and chest pain at PAT appointment   All instructions explained to the patient, with a verbal understanding of the material. Patient agrees to go over the instructions while at home for a better understanding. The opportunity to ask questions was provided.

## 2024-01-27 ENCOUNTER — Ambulatory Visit: Admitting: Vascular Surgery

## 2024-01-27 ENCOUNTER — Ambulatory Visit (HOSPITAL_COMMUNITY)
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
## 2024-01-28 NOTE — Anesthesia Preprocedure Evaluation (Addendum)
 Anesthesia Evaluation  Patient identified by MRN, date of birth, ID band Patient awake    Reviewed: Allergy & Precautions, NPO status , Patient's Chart, lab work & pertinent test results, reviewed documented beta blocker date and time   History of Anesthesia Complications (+) PROLONGED EMERGENCE and history of anesthetic complications  Airway Mallampati: II  TM Distance: >3 FB Neck ROM: Limited    Dental no notable dental hx. (+)    Pulmonary neg shortness of breath, asthma , pneumonia, neg recent URI, former smoker   breath sounds clear to auscultation       Cardiovascular hypertension, (-) angina + Peripheral Vascular Disease  (-) CAD and (-) Past MI (-) dysrhythmias (-) pacemaker Rhythm:Regular Rate:Normal  Normal stress   Neuro/Psych  Headaches, neg Seizures  Anxiety      Neuromuscular disease    GI/Hepatic ,GERD  ,,(+) neg Cirrhosis        Endo/Other  diabetes, Type 2    Renal/GU Renal disease     Musculoskeletal  (+) Arthritis ,    Abdominal   Peds  Hematology   Anesthesia Other Findings   Reproductive/Obstetrics                              Anesthesia Physical Anesthesia Plan  ASA: 3  Anesthesia Plan: General   Post-op Pain Management:    Induction: Intravenous  PONV Risk Score and Plan: 2 and Ondansetron  and Dexamethasone   Airway Management Planned: Oral ETT  Additional Equipment:   Intra-op Plan:   Post-operative Plan: Extubation in OR  Informed Consent: I have reviewed the patients History and Physical, chart, labs and discussed the procedure including the risks, benefits and alternatives for the proposed anesthesia with the patient or authorized representative who has indicated his/her understanding and acceptance.     Dental advisory given  Plan Discussed with: CRNA  Anesthesia Plan Comments: (PAT note written 01/28/2024 by Allison Zelenak, PA-C.  )          Anesthesia Quick Evaluation

## 2024-02-01 ENCOUNTER — Ambulatory Visit (HOSPITAL_COMMUNITY)

## 2024-02-01 ENCOUNTER — Other Ambulatory Visit: Payer: Self-pay

## 2024-02-01 ENCOUNTER — Encounter (HOSPITAL_COMMUNITY): Payer: Self-pay

## 2024-02-01 ENCOUNTER — Ambulatory Visit (HOSPITAL_COMMUNITY): Payer: Self-pay | Admitting: Anesthesiology

## 2024-02-01 ENCOUNTER — Observation Stay (HOSPITAL_COMMUNITY)
Admission: RE | Admit: 2024-02-01 | Discharge: 2024-02-02 | Disposition: A | Attending: Neurosurgery | Admitting: Neurosurgery

## 2024-02-01 ENCOUNTER — Ambulatory Visit (HOSPITAL_COMMUNITY): Admission: RE | Disposition: A | Payer: Self-pay | Source: Home / Self Care | Attending: Neurosurgery

## 2024-02-01 ENCOUNTER — Ambulatory Visit (HOSPITAL_COMMUNITY): Payer: Self-pay | Admitting: Medical

## 2024-02-01 DIAGNOSIS — J45909 Unspecified asthma, uncomplicated: Secondary | ICD-10-CM | POA: Insufficient documentation

## 2024-02-01 DIAGNOSIS — M4316 Spondylolisthesis, lumbar region: Secondary | ICD-10-CM | POA: Insufficient documentation

## 2024-02-01 DIAGNOSIS — Z91040 Latex allergy status: Secondary | ICD-10-CM | POA: Insufficient documentation

## 2024-02-01 DIAGNOSIS — Z85828 Personal history of other malignant neoplasm of skin: Secondary | ICD-10-CM | POA: Diagnosis not present

## 2024-02-01 DIAGNOSIS — Z7984 Long term (current) use of oral hypoglycemic drugs: Secondary | ICD-10-CM | POA: Insufficient documentation

## 2024-02-01 DIAGNOSIS — M5416 Radiculopathy, lumbar region: Principal | ICD-10-CM | POA: Diagnosis present

## 2024-02-01 DIAGNOSIS — Z7902 Long term (current) use of antithrombotics/antiplatelets: Secondary | ICD-10-CM | POA: Insufficient documentation

## 2024-02-01 DIAGNOSIS — I739 Peripheral vascular disease, unspecified: Secondary | ICD-10-CM | POA: Diagnosis not present

## 2024-02-01 DIAGNOSIS — E119 Type 2 diabetes mellitus without complications: Secondary | ICD-10-CM | POA: Insufficient documentation

## 2024-02-01 DIAGNOSIS — Z87891 Personal history of nicotine dependence: Secondary | ICD-10-CM | POA: Diagnosis not present

## 2024-02-01 DIAGNOSIS — Z7982 Long term (current) use of aspirin: Secondary | ICD-10-CM | POA: Diagnosis not present

## 2024-02-01 DIAGNOSIS — I10 Essential (primary) hypertension: Secondary | ICD-10-CM

## 2024-02-01 LAB — GLUCOSE, CAPILLARY
Glucose-Capillary: 133 mg/dL — ABNORMAL HIGH (ref 70–99)
Glucose-Capillary: 143 mg/dL — ABNORMAL HIGH (ref 70–99)
Glucose-Capillary: 264 mg/dL — ABNORMAL HIGH (ref 70–99)
Glucose-Capillary: 249 mg/dL — ABNORMAL HIGH (ref 70–99)
Glucose-Capillary: 248 mg/dL — ABNORMAL HIGH (ref 70–99)
Glucose-Capillary: 175 mg/dL — ABNORMAL HIGH (ref 70–99)

## 2024-02-01 SURGERY — POSTERIOR LUMBAR FUSION 2 LEVEL
Anesthesia: General

## 2024-02-01 MED ORDER — SODIUM CHLORIDE 0.9% FLUSH
3.0000 mL | INTRAVENOUS | Status: DC | PRN
Start: 2024-02-01 — End: 2024-02-02

## 2024-02-01 MED ORDER — THROMBIN 5000 UNITS EX SOLR
OROMUCOSAL | Status: DC | PRN
  Administered 2024-02-01 (×2): 5 mL via TOPICAL

## 2024-02-01 MED ORDER — HYDROMORPHONE HCL 1 MG/ML IJ SOLN
0.5000 mg | INTRAMUSCULAR | Status: DC | PRN
  Administered 2024-02-01 (×2): 0.5 mg via INTRAVENOUS
  Filled 2024-02-01 (×2): qty 0.5

## 2024-02-01 MED ORDER — IPRATROPIUM-ALBUTEROL 0.5-2.5 (3) MG/3ML IN SOLN
RESPIRATORY_TRACT | Status: AC
  Filled 2024-02-01: qty 3

## 2024-02-01 MED ORDER — ACETAMINOPHEN 650 MG RE SUPP: 650.0000 mg | RECTAL | Status: DC | PRN

## 2024-02-01 MED ORDER — ALBUTEROL SULFATE (2.5 MG/3ML) 0.083% IN NEBU: 3.0000 mL | INHALATION_SOLUTION | Freq: Four times a day (QID) | RESPIRATORY_TRACT | Status: DC | PRN

## 2024-02-01 MED ORDER — ONDANSETRON HCL 4 MG/2ML IJ SOLN
4.0000 mg | Freq: Once | INTRAMUSCULAR | Status: AC | PRN
  Administered 2024-02-01: 4 mg via INTRAVENOUS

## 2024-02-01 MED ORDER — PHENYLEPHRINE 80 MCG/ML (10ML) SYRINGE FOR IV PUSH (FOR BLOOD PRESSURE SUPPORT)
PREFILLED_SYRINGE | INTRAVENOUS | Status: DC | PRN
  Administered 2024-02-01 (×2): 80 ug via INTRAVENOUS
  Administered 2024-02-01: 40 ug via INTRAVENOUS
  Administered 2024-02-01 (×3): 80 ug via INTRAVENOUS

## 2024-02-01 MED ORDER — CEFAZOLIN SODIUM-DEXTROSE 2-4 GM/100ML-% IV SOLN
2.0000 g | Freq: Four times a day (QID) | INTRAVENOUS | Status: AC
  Administered 2024-02-01 (×2): 2 g via INTRAVENOUS
  Filled 2024-02-01 (×2): qty 100

## 2024-02-01 MED ORDER — CITALOPRAM HYDROBROMIDE 20 MG PO TABS
20.0000 mg | ORAL_TABLET | Freq: Every day | ORAL | Status: DC
Start: 2024-02-01 — End: 2024-02-02
  Administered 2024-02-01: 20 mg via ORAL
  Filled 2024-02-01: qty 1

## 2024-02-01 MED ORDER — ACETAMINOPHEN 10 MG/ML IV SOLN: 1000.0000 mg | Freq: Once | INTRAVENOUS | Status: DC | PRN

## 2024-02-01 MED ORDER — FENTANYL CITRATE (PF) 100 MCG/2ML IJ SOLN
25.0000 ug | INTRAMUSCULAR | Status: DC | PRN
  Administered 2024-02-01 (×2): 50 ug via INTRAVENOUS

## 2024-02-01 MED ORDER — ACYCLOVIR 400 MG PO TABS: 400.0000 mg | ORAL_TABLET | Freq: Two times a day (BID) | ORAL | Status: DC | PRN

## 2024-02-01 MED ORDER — SODIUM CHLORIDE (PF) 0.9 % IJ SOLN
INTRAMUSCULAR | Status: DC | PRN
  Administered 2024-02-01: 10 mL via INTRAVENOUS

## 2024-02-01 MED ORDER — INSULIN ASPART 100 UNIT/ML IJ SOLN
0.0000 [IU] | INTRAMUSCULAR | Status: AC | PRN
  Administered 2024-02-01: 2 [IU] via SUBCUTANEOUS
  Administered 2024-02-01: 8 [IU] via SUBCUTANEOUS

## 2024-02-01 MED ORDER — PROPOFOL 10 MG/ML IV BOLUS
INTRAVENOUS | Status: DC | PRN
  Administered 2024-02-01: 20 mg via INTRAVENOUS
  Administered 2024-02-01: 160 mg via INTRAVENOUS

## 2024-02-01 MED ORDER — FENTANYL CITRATE (PF) 100 MCG/2ML IJ SOLN
INTRAMUSCULAR | Status: DC | PRN
  Administered 2024-02-01 (×4): 50 ug via INTRAVENOUS

## 2024-02-01 MED ORDER — MIDAZOLAM HCL (PF) 2 MG/2ML IJ SOLN
INTRAMUSCULAR | Status: DC | PRN
  Administered 2024-02-01: 2 mg via INTRAVENOUS

## 2024-02-01 MED ORDER — BUPIVACAINE HCL (PF) 0.5 % IJ SOLN
INTRAMUSCULAR | Status: DC | PRN
  Administered 2024-02-01: 5 mL via INTRA_ARTICULAR

## 2024-02-01 MED ORDER — PHENOL 1.4 % MT LIQD: 1.0000 | OROMUCOSAL | Status: DC | PRN

## 2024-02-01 MED ORDER — ROCURONIUM BROMIDE 10 MG/ML (PF) SYRINGE
PREFILLED_SYRINGE | INTRAVENOUS | Status: AC
  Filled 2024-02-01: qty 10

## 2024-02-01 MED ORDER — OXYCODONE HCL 5 MG PO TABS: 5.0000 mg | ORAL_TABLET | ORAL | Status: DC | PRN

## 2024-02-01 MED ORDER — GLIPIZIDE ER 5 MG PO TB24
5.0000 mg | ORAL_TABLET | Freq: Every day | ORAL | Status: DC
  Filled 2024-02-01: qty 1

## 2024-02-01 MED ORDER — FENTANYL CITRATE (PF) 100 MCG/2ML IJ SOLN
INTRAMUSCULAR | Status: AC
  Filled 2024-02-01: qty 2

## 2024-02-01 MED ORDER — CHLORHEXIDINE GLUCONATE CLOTH 2 % EX PADS: 6.0000 | MEDICATED_PAD | Freq: Once | CUTANEOUS | Status: DC

## 2024-02-01 MED ORDER — MENTHOL 3 MG MT LOZG: 1.0000 | LOZENGE | OROMUCOSAL | Status: DC | PRN

## 2024-02-01 MED ORDER — LIDOCAINE-EPINEPHRINE 1 %-1:100000 IJ SOLN
INTRAMUSCULAR | Status: DC | PRN
  Administered 2024-02-01: 5 mL

## 2024-02-01 MED ORDER — VANCOMYCIN HCL 1000 MG IV SOLR
INTRAVENOUS | Status: AC
Start: 2024-02-01 — End: 2024-02-01
  Filled 2024-02-01: qty 20

## 2024-02-01 MED ORDER — ALBUMIN HUMAN 5 % IV SOLN: INTRAVENOUS | Status: DC | PRN

## 2024-02-01 MED ORDER — FLEET ENEMA RE ENEM: 1.0000 | ENEMA | Freq: Once | RECTAL | Status: DC | PRN

## 2024-02-01 MED ORDER — ROCURONIUM BROMIDE 10 MG/ML (PF) SYRINGE
PREFILLED_SYRINGE | INTRAVENOUS | Status: DC | PRN
Start: 2024-02-01 — End: 2024-02-01
  Administered 2024-02-01: 60 mg via INTRAVENOUS
  Administered 2024-02-01 (×8): 10 mg via INTRAVENOUS

## 2024-02-01 MED ORDER — THROMBIN 5000 UNITS EX KIT
PACK | CUTANEOUS | Status: AC
  Filled 2024-02-01: qty 1

## 2024-02-01 MED ORDER — GABAPENTIN 300 MG PO CAPS
300.0000 mg | ORAL_CAPSULE | Freq: Every day | ORAL | Status: DC
  Administered 2024-02-01: 300 mg via ORAL
  Filled 2024-02-01: qty 1

## 2024-02-01 MED ORDER — IRBESARTAN 150 MG PO TABS
150.0000 mg | ORAL_TABLET | Freq: Every day | ORAL | Status: DC
  Administered 2024-02-01: 150 mg via ORAL
  Filled 2024-02-01: qty 1

## 2024-02-01 MED ORDER — SODIUM CHLORIDE 0.9% FLUSH
3.0000 mL | Freq: Two times a day (BID) | INTRAVENOUS | Status: DC
  Administered 2024-02-01 (×2): 3 mL via INTRAVENOUS

## 2024-02-01 MED ORDER — LIDOCAINE-EPINEPHRINE 1 %-1:100000 IJ SOLN
INTRAMUSCULAR | Status: AC
  Filled 2024-02-01: qty 1

## 2024-02-01 MED ORDER — ACETAMINOPHEN 10 MG/ML IV SOLN
INTRAVENOUS | Status: DC | PRN
Start: 2024-02-01 — End: 2024-02-01
  Administered 2024-02-01: 1000 mg via INTRAVENOUS

## 2024-02-01 MED ORDER — LIDOCAINE 2% (20 MG/ML) 5 ML SYRINGE
INTRAMUSCULAR | Status: DC | PRN
Start: 2024-02-01 — End: 2024-02-01
  Administered 2024-02-01: 60 mg via INTRAVENOUS

## 2024-02-01 MED ORDER — THROMBIN 5000 UNITS EX KIT
PACK | CUTANEOUS | Status: AC
Start: 2024-02-01 — End: 2024-02-01
  Filled 2024-02-01: qty 1

## 2024-02-01 MED ORDER — IPRATROPIUM-ALBUTEROL 0.5-2.5 (3) MG/3ML IN SOLN
3.0000 mL | Freq: Once | RESPIRATORY_TRACT | Status: AC
Start: 2024-02-01 — End: 2024-02-01
  Administered 2024-02-01: 3 mL via RESPIRATORY_TRACT

## 2024-02-01 MED ORDER — PRAVASTATIN SODIUM 40 MG PO TABS
40.0000 mg | ORAL_TABLET | Freq: Every day | ORAL | Status: DC
Start: 2024-02-01 — End: 2024-02-02
  Administered 2024-02-01: 40 mg via ORAL
  Filled 2024-02-01: qty 1

## 2024-02-01 MED ORDER — CHLORHEXIDINE GLUCONATE 0.12 % MT SOLN
15.0000 mL | Freq: Once | OROMUCOSAL | Status: AC
  Administered 2024-02-01: 15 mL via OROMUCOSAL
  Filled 2024-02-01: qty 15

## 2024-02-01 MED ORDER — SODIUM CHLORIDE 0.9 % IV SOLN
250.0000 mL | INTRAVENOUS | Status: DC
  Administered 2024-02-01: 250 mL via INTRAVENOUS

## 2024-02-01 MED ORDER — ONDANSETRON HCL 4 MG/2ML IJ SOLN
INTRAMUSCULAR | Status: DC | PRN
  Administered 2024-02-01: 4 mg via INTRAVENOUS

## 2024-02-01 MED ORDER — LINAGLIPTIN 5 MG PO TABS
5.0000 mg | ORAL_TABLET | Freq: Every day | ORAL | Status: DC
  Administered 2024-02-01: 5 mg via ORAL
  Filled 2024-02-01: qty 1

## 2024-02-01 MED ORDER — CEFAZOLIN SODIUM-DEXTROSE 2-4 GM/100ML-% IV SOLN
2.0000 g | INTRAVENOUS | Status: AC
  Administered 2024-02-01: 2 g via INTRAVENOUS
  Filled 2024-02-01: qty 100

## 2024-02-01 MED ORDER — PROPOFOL 10 MG/ML IV BOLUS
INTRAVENOUS | Status: AC
  Filled 2024-02-01: qty 20

## 2024-02-01 MED ORDER — SUGAMMADEX SODIUM 200 MG/2ML IV SOLN
INTRAVENOUS | Status: DC | PRN
Start: 2024-02-01 — End: 2024-02-01
  Administered 2024-02-01: 166 mg via INTRAVENOUS

## 2024-02-01 MED ORDER — FUROSEMIDE 40 MG PO TABS: 40.0000 mg | ORAL_TABLET | Freq: Every day | ORAL | Status: DC | PRN

## 2024-02-01 MED ORDER — ONDANSETRON HCL 4 MG/2ML IJ SOLN
INTRAMUSCULAR | Status: AC
  Filled 2024-02-01: qty 2

## 2024-02-01 MED ORDER — ACETAMINOPHEN 325 MG PO TABS
650.0000 mg | ORAL_TABLET | ORAL | Status: DC | PRN
  Administered 2024-02-02: 650 mg via ORAL
  Filled 2024-02-01: qty 2

## 2024-02-01 MED ORDER — KETAMINE HCL 50 MG/5ML IJ SOSY
PREFILLED_SYRINGE | INTRAMUSCULAR | Status: AC
  Filled 2024-02-01: qty 5

## 2024-02-01 MED ORDER — KETOROLAC TROMETHAMINE 15 MG/ML IJ SOLN
15.0000 mg | Freq: Four times a day (QID) | INTRAMUSCULAR | Status: DC
  Administered 2024-02-01 – 2024-02-02 (×3): 15 mg via INTRAVENOUS
  Filled 2024-02-01 (×3): qty 1

## 2024-02-01 MED ORDER — ONDANSETRON HCL 4 MG PO TABS: 4.0000 mg | ORAL_TABLET | Freq: Four times a day (QID) | ORAL | Status: DC | PRN

## 2024-02-01 MED ORDER — PHENYLEPHRINE HCL-NACL 20-0.9 MG/250ML-% IV SOLN
INTRAVENOUS | Status: DC | PRN
  Administered 2024-02-01: 40 ug/min via INTRAVENOUS

## 2024-02-01 MED ORDER — DOCUSATE SODIUM 100 MG PO CAPS
100.0000 mg | ORAL_CAPSULE | Freq: Two times a day (BID) | ORAL | Status: DC
  Administered 2024-02-01: 100 mg via ORAL
  Filled 2024-02-01: qty 1

## 2024-02-01 MED ORDER — BUPIVACAINE HCL (PF) 0.5 % IJ SOLN
INTRAMUSCULAR | Status: AC
Start: 2024-02-01 — End: 2024-02-01
  Filled 2024-02-01: qty 30

## 2024-02-01 MED ORDER — DEXAMETHASONE SOD PHOSPHATE PF 10 MG/ML IJ SOLN
INTRAMUSCULAR | Status: DC | PRN
  Administered 2024-02-01: 5 mg via INTRAVENOUS

## 2024-02-01 MED ORDER — LIDOCAINE 2% (20 MG/ML) 5 ML SYRINGE
INTRAMUSCULAR | Status: AC
  Filled 2024-02-01: qty 5

## 2024-02-01 MED ORDER — POLYETHYLENE GLYCOL 3350 17 G PO PACK
17.0000 g | PACK | Freq: Every day | ORAL | Status: DC | PRN
Start: 2024-02-01 — End: 2024-02-02

## 2024-02-01 MED ORDER — KETAMINE HCL 50 MG/5ML IJ SOSY
PREFILLED_SYRINGE | INTRAMUSCULAR | Status: DC | PRN
  Administered 2024-02-01: 25 mg via INTRAVENOUS

## 2024-02-01 MED ORDER — ALPRAZOLAM 0.5 MG PO TABS: 0.5000 mg | ORAL_TABLET | Freq: Two times a day (BID) | ORAL | Status: DC | PRN

## 2024-02-01 MED ORDER — IPRATROPIUM-ALBUTEROL 0.5-2.5 (3) MG/3ML IN SOLN: 3.0000 mL | RESPIRATORY_TRACT | Status: DC

## 2024-02-01 MED ORDER — BUPIVACAINE LIPOSOME 1.3 % IJ SUSP
INTRAMUSCULAR | Status: AC
  Filled 2024-02-01: qty 20

## 2024-02-01 MED ORDER — OXYCODONE HCL 5 MG PO TABS
5.0000 mg | ORAL_TABLET | Freq: Once | ORAL | Status: AC | PRN
  Administered 2024-02-01: 5 mg via ORAL

## 2024-02-01 MED ORDER — HYDROXYZINE HCL 50 MG/ML IM SOLN
50.0000 mg | Freq: Four times a day (QID) | INTRAMUSCULAR | Status: DC | PRN
  Administered 2024-02-01: 50 mg via INTRAMUSCULAR
  Filled 2024-02-01: qty 1

## 2024-02-01 MED ORDER — METFORMIN HCL 500 MG PO TABS
1000.0000 mg | ORAL_TABLET | Freq: Two times a day (BID) | ORAL | Status: DC
  Administered 2024-02-01: 1000 mg via ORAL
  Filled 2024-02-01: qty 2

## 2024-02-01 MED ORDER — MIDAZOLAM HCL 2 MG/2ML IJ SOLN
INTRAMUSCULAR | Status: AC
  Filled 2024-02-01: qty 2

## 2024-02-01 MED ORDER — BACLOFEN 10 MG PO TABS: 10.0000 mg | ORAL_TABLET | Freq: Three times a day (TID) | ORAL | Status: DC | PRN

## 2024-02-01 MED ORDER — OXYCODONE HCL 5 MG PO TABS
10.0000 mg | ORAL_TABLET | ORAL | Status: DC | PRN
  Administered 2024-02-01 – 2024-02-02 (×4): 10 mg via ORAL
  Filled 2024-02-01 (×4): qty 2

## 2024-02-01 MED ORDER — 0.9 % SODIUM CHLORIDE (POUR BTL) OPTIME
TOPICAL | Status: DC | PRN
  Administered 2024-02-01: 1000 mL

## 2024-02-01 MED ORDER — ORAL CARE MOUTH RINSE: 15.0000 mL | Freq: Once | OROMUCOSAL | Status: AC

## 2024-02-01 MED ORDER — LACTATED RINGERS IV SOLN: INTRAVENOUS | Status: DC

## 2024-02-01 MED ORDER — PHENYLEPHRINE 80 MCG/ML (10ML) SYRINGE FOR IV PUSH (FOR BLOOD PRESSURE SUPPORT)
PREFILLED_SYRINGE | INTRAVENOUS | Status: AC
  Filled 2024-02-01: qty 10

## 2024-02-01 MED ORDER — ONDANSETRON HCL 4 MG/2ML IJ SOLN
INTRAMUSCULAR | Status: AC
Start: 2024-02-01 — End: 2024-02-01
  Filled 2024-02-01: qty 2

## 2024-02-01 MED ORDER — ONDANSETRON HCL 4 MG/2ML IJ SOLN: 4.0000 mg | Freq: Four times a day (QID) | INTRAMUSCULAR | Status: DC | PRN

## 2024-02-01 MED ORDER — ACETAMINOPHEN 10 MG/ML IV SOLN
INTRAVENOUS | Status: AC
  Filled 2024-02-01: qty 100

## 2024-02-01 MED ORDER — BUPIVACAINE LIPOSOME 1.3 % IJ SUSP
INTRAMUSCULAR | Status: DC | PRN
  Administered 2024-02-01: 20 mL
  Administered 2024-02-01: 25 mL

## 2024-02-01 MED ORDER — OXYCODONE HCL 5 MG PO TABS
ORAL_TABLET | ORAL | Status: AC
  Filled 2024-02-01: qty 1

## 2024-02-01 MED ORDER — OXYCODONE HCL 5 MG/5ML PO SOLN: 5.0000 mg | Freq: Once | ORAL | Status: AC | PRN

## 2024-02-01 MED ORDER — PANTOPRAZOLE SODIUM 40 MG PO TBEC: 40.0000 mg | DELAYED_RELEASE_TABLET | Freq: Every day | ORAL | Status: DC | PRN

## 2024-02-01 SURGICAL SUPPLY — 79 items
BAG COUNTER SPONGE SURGICOUNT (BAG) ×1 IMPLANT
BAND RUBBER #18 3X1/16 STRL (MISCELLANEOUS) ×2 IMPLANT
BASKET BONE COLLECTION (BASKET) ×1 IMPLANT
BLADE BONE MILL MEDIUM (MISCELLANEOUS) IMPLANT
BLADE CLIPPER SURG (BLADE) IMPLANT
BUR 14 MATCH 3 (BUR) IMPLANT
BUR MATCHSTICK NEURO 3.0 LAGG (BURR) ×1 IMPLANT
BUR MR8 14 BALL 5 (BUR) IMPLANT
BUR PRECISION FLUTE 5.0 (BURR) ×1 IMPLANT
CANISTER SUCTION 3000ML PPV (SUCTIONS) ×1 IMPLANT
CNTNR URN SCR LID CUP LEK RST (MISCELLANEOUS) ×1 IMPLANT
COVER BACK TABLE 60X90IN (DRAPES) ×1 IMPLANT
DERMABOND ADVANCED .7 DNX12 (GAUZE/BANDAGES/DRESSINGS) ×2 IMPLANT
DRAIN JACKSON PRATT 10MM FLAT (MISCELLANEOUS) IMPLANT
DRAPE 3/4 80X56 (DRAPES) ×1 IMPLANT
DRAPE C-ARM 42X72 X-RAY (DRAPES) ×1 IMPLANT
DRAPE C-ARMOR (DRAPES) ×1 IMPLANT
DRAPE LAPAROTOMY 100X72X124 (DRAPES) ×1 IMPLANT
DRAPE MICROSCOPE SLANT 54X150 (MISCELLANEOUS) IMPLANT
DRAPE SHEET LG 3/4 BI-LAMINATE (DRAPES) ×4 IMPLANT
DRSG OPSITE POSTOP 4X6 (GAUZE/BANDAGES/DRESSINGS) IMPLANT
DURAPREP 26ML APPLICATOR (WOUND CARE) ×1 IMPLANT
ELECTRODE BLDE INSULATED 6.5IN (ELECTROSURGICAL) ×1 IMPLANT
ELECTRODE REM PT RTRN 9FT ADLT (ELECTROSURGICAL) ×1 IMPLANT
EVACUATOR SILICONE 100CC (DRAIN) IMPLANT
EXTENDER TAB GUIDE SV 5.5/6.0 (INSTRUMENTS) IMPLANT
FEE COVERAGE SUPPORT O-ARM (MISCELLANEOUS) ×1 IMPLANT
GAUZE 4X4 16PLY ~~LOC~~+RFID DBL (SPONGE) IMPLANT
GAUZE SPONGE 4X4 12PLY STRL (GAUZE/BANDAGES/DRESSINGS) IMPLANT
GLOVE BIO SURGEON STRL SZ7 (GLOVE) ×4 IMPLANT
GLOVE BIOGEL PI IND STRL 7.5 (GLOVE) ×2 IMPLANT
GLOVE BIOGEL PI IND STRL 8 (GLOVE) ×4 IMPLANT
GLOVE ECLIPSE 8.0 STRL XLNG CF (GLOVE) ×2 IMPLANT
GLOVE SURG SS PI 7.0 STRL IVOR (GLOVE) IMPLANT
GLOVE SURG SS PI 8.0 STRL IVOR (GLOVE) IMPLANT
GOWN STRL REUS W/ TWL LRG LVL3 (GOWN DISPOSABLE) ×1 IMPLANT
GOWN STRL REUS W/ TWL XL LVL3 (GOWN DISPOSABLE) ×2 IMPLANT
GOWN STRL REUS W/TWL 2XL LVL3 (GOWN DISPOSABLE) IMPLANT
HEMOSTAT POWDER KIT SURGIFOAM (HEMOSTASIS) ×1 IMPLANT
KIT BASIN OR (CUSTOM PROCEDURE TRAY) ×1 IMPLANT
KIT INFUSE SMALL (Orthopedic Implant) IMPLANT
KIT TURNOVER KIT B (KITS) ×1 IMPLANT
KNIFE SURG 188MM BAYONET LONG (INSTRUMENTS) IMPLANT
MARKER SPHERE PSV REFLC NDI (MISCELLANEOUS) ×5 IMPLANT
MILL BONE PREP (MISCELLANEOUS) ×1 IMPLANT
NDL HYPO 18GX1.5 BLUNT FILL (NEEDLE) IMPLANT
NDL HYPO 21X1.5 SAFETY (NEEDLE) ×1 IMPLANT
NDL HYPO 22X1.5 SAFETY MO (MISCELLANEOUS) ×1 IMPLANT
NDL SPNL 18GX3.5 QUINCKE PK (NEEDLE) IMPLANT
NEEDLE HYPO 18GX1.5 BLUNT FILL (NEEDLE) IMPLANT
NEEDLE HYPO 21X1.5 SAFETY (NEEDLE) ×1 IMPLANT
NEEDLE HYPO 22X1.5 SAFETY MO (MISCELLANEOUS) ×1 IMPLANT
NEEDLE SPNL 18GX3.5 QUINCKE PK (NEEDLE) IMPLANT
PACK LAMINECTOMY NEURO (CUSTOM PROCEDURE TRAY) ×1 IMPLANT
PAD ARMBOARD POSITIONER FOAM (MISCELLANEOUS) ×3 IMPLANT
PUTTY GRAFTON DBF 6CC W/DELIVE (Putty) IMPLANT
ROD PERC CCM 5.5X55 (Rod) IMPLANT
ROD PERC CCM 5.5X65 (Rod) IMPLANT
SCREW 5.5 VOYAGER MAS 6.5X50 (Screw) IMPLANT
SCREW 5.5 VOYAGER MAS 7.5X50 (Screw) IMPLANT
SCREW MAS VOYAGER 7.5X45 (Screw) IMPLANT
SCREW SET 5.5/6.0MM SOLERA (Screw) IMPLANT
SOLN 0.9% NACL POUR BTL 1000ML (IV SOLUTION) ×1 IMPLANT
SOLN STERILE WATER BTL 1000 ML (IV SOLUTION) ×1 IMPLANT
SPACER ARTIC-L 12X30X10 TIT 5D (Spacer) IMPLANT
SPACER ARTIC-L 12X30X8 5D (Spacer) IMPLANT
SPIKE FLUID TRANSFER (MISCELLANEOUS) ×1 IMPLANT
SPONGE SURGIFOAM ABS GEL 100 (HEMOSTASIS) IMPLANT
SPONGE T-LAP 4X18 ~~LOC~~+RFID (SPONGE) IMPLANT
STAPLER SKIN PROX 35W (STAPLE) ×1 IMPLANT
SUT MNCRL AB 4-0 PS2 18 (SUTURE) ×1 IMPLANT
SUT VIC AB 0 CT1 18XCR BRD8 (SUTURE) ×1 IMPLANT
SUT VIC AB 2-0 CP2 18 (SUTURE) ×1 IMPLANT
SYR 20ML LL LF (SYRINGE) IMPLANT
SYR 30ML LL (SYRINGE) ×1 IMPLANT
TOWEL GREEN STERILE (TOWEL DISPOSABLE) ×1 IMPLANT
TOWEL GREEN STERILE FF (TOWEL DISPOSABLE) ×1 IMPLANT
TRAY FOLEY MTR SLVR 16FR STAT (SET/KITS/TRAYS/PACK) ×1 IMPLANT
TRAY FOLEY W/BAG SLVR 14FR (SET/KITS/TRAYS/PACK) IMPLANT

## 2024-02-01 NOTE — Progress Notes (Signed)
 Orthopedic Tech Progress Note Patient Details:  Haley Dunn 09-29-1965 [id number]  Ortho Devices Type of Ortho Device: Lumbar corsett Ortho Device/Splint Location: Back Ortho Device/Splint Interventions: Ordered, Adjustment      [name] A [name] 02/01/2024, 4:04 PM

## 2024-02-01 NOTE — Anesthesia Postprocedure Evaluation (Signed)
 Anesthesia Post Note  Patient: Haley Dunn  Procedure(s) Performed: Transforaminal Lumbar Interbody Fusion Lumbar four-Lumbar five Lumbar five-Lumbar sacral one ; Minimally Invasive Surgery with metrix APPLICATION OF O-ARM     Patient location during evaluation: PACU Anesthesia Type: General Level of consciousness: awake and alert Pain management: pain level controlled Vital Signs Assessment: post-procedure vital signs reviewed and stable Respiratory status: spontaneous breathing, nonlabored ventilation, respiratory function stable and patient connected to nasal cannula oxygen Cardiovascular status: blood pressure returned to baseline and stable Postop Assessment: no apparent nausea or vomiting Anesthetic complications: no   No notable events documented.  Last Vitals:  Vitals:   02/01/24 1415 02/01/24 1418  BP: 136/67   Pulse: 95 92  Resp: 14 14  Temp:  36.5 C  SpO2: 97% 96%    Last Pain:  Vitals:   02/01/24 1453  TempSrc:   PainSc: 10-Worst pain ever                 Lynwood MARLA Cornea

## 2024-02-01 NOTE — Plan of Care (Signed)
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
## 2024-02-01 NOTE — Transfer of Care (Signed)
 Immediate Anesthesia Transfer of Care Note  Patient: Haley Dunn  Procedure(s) Performed: Transforaminal Lumbar Interbody Fusion Lumbar four-Lumbar five Lumbar five-Lumbar sacral one ; Minimally Invasive Surgery with metrix APPLICATION OF O-ARM  Patient Location: PACU  Anesthesia Type:General  Level of Consciousness: awake, alert , and oriented  Airway & Oxygen Therapy: Patient Spontanous Breathing and Patient connected to face mask oxygen  Post-op Assessment: Report given to RN, Post -op Vital signs reviewed and stable, Patient moving all extremities X 4, and Patient able to stick tongue midline  Post vital signs: Reviewed and stable  Last Vitals:  Vitals Value Taken Time  BP 143/73 02/01/24 12:45  Temp 37.6 C 02/01/24 12:45  Pulse 98 02/01/24 12:45  Resp 11 02/01/24 12:45  SpO2 94 % 02/01/24 12:45  Vitals shown include unfiled device data.  Last Pain:  Vitals:   02/01/24 0615  TempSrc:   PainSc: 0-No pain         Complications: No notable events documented.

## 2024-02-01 NOTE — Op Note (Signed)
 PREOP DIAGNOSIS: Lumbar spondylolisthesis and radiculopathy  POSTOP DIAGNOSIS: Lumbar spondylolisthesis and radiculopathy  PROCEDURE: 1. L4-5, L5-S1 lumbar interbody fusion via left transforaminal approach with tubular retractors 2. Posterolateral arthrodesis, L4-L5-S1 3. Placement of interbody cage L4-5, L5-S1 4. Segmental instrumentation with percutaneously placed pedicle screw and rod construct at L4-L5-S1 5. Harvest of local autograft 6. Use of morselized allograft 7. Use of microscope for microdissection 8. Intraoperative neuronavigation with Stealth 9. Intraoperative CT scan  SURGEON: Dr. Dorn Ned, MD  ASSISTANT: Dorn Glade, MD, Camie Pickle, [place].  Please note, there were no qualified trainees available to assist with the procedure.  An assistant was required for aid in retraction of the neural elements.   ANESTHESIA: General Endotracheal  EBL: 100 ml  IMPLANTS: [employer]  6.5 x 50 mm screws at L4, 7.5 x 50 mm screws at L5, 7.5 x 45 mm screws at S1 10 x 30 x 5 degree cage at L4-5 8 x 30 x 5 degree cage at L5-S1  SPECIMENS: None  DRAINS: None  COMPLICATIONS: None  CONDITION: Stable to PACU  HISTORY: Haley Dunn is a 58 y.o. female who initially presented to the outpatient clinic with back pain and left greater than right lumbar radiculopathy.  She is found to have L4-L5 spinal listhesis with moderate to severe stenosis as well as mild listhesis with severe disc degeneration and moderate stenosis at L5-S1.  Treatment options were discussed and the patient elected to proceed with MIS TLIF at L4-5 and L5-S1. risks, benefits, alternatives, and expected convalescence were discussed with the patient.  Risks discussed included but were not limited to bleeding, pain, infection, scar, pseudoarthrosis, CSF leak, neurologic deficit, paralysis, and death.  The patient wished to proceed with surgery and informed consent was obtained.  PROCEDURE IN DETAIL: After informed  consent was obtained and witnessed, the patient was brought to the operating room. After induction of general anesthesia, the patient was positioned on the operative table in the prone position on a Jackson table with all pressure points meticulously padded. The skin of the low back was then prepped and draped in the usual sterile fashion.  A PSIS iliac spine was placed, and intraoperative CT obtained to allow for neuronavigation.  Using the Stealth navigation, paramedian incisions were planned for pedicle screw trajectories.  Incisions were made with a 10 blade.  Navigated high speed drill was used to cannulate the pedicles, and pedicles were then tapped using navigation.  Ball ended feeler confirmed good cannulation with no breaches.  On the side contralateral to the TLIF, appropriately sized pedicle screws were placed using navigation at L4, L5, and S1.   Navigated initial dilator was then docked on the left L4-5 facet .  Sequential dilators and final tubular retractor was placed and locked in position, with navigation confirming good placement.  The microscope was then introduced in the field to allow for intraoperative microdissection.  Remaining muscle was moved off of the degenerated facet joint.  The inferior facets was removed using an osteotome and harvested for autograft.  Overgrown superior facets was then removed with rongeurs.  The traversing nerve root was identified and good decompression was performed with removal of overgrown ligament and facets with rongeurs.  The exiting nerve root in the foramen was also seen.  Epidural veins in the foramen were coagulated and cut with allowed access to the disc space lateral to the traversing nerve root.  The traversing nerve root was gently retracted medially to allow greater access to the disc.  The disc was opened with 15 blade and pituitary rongeur was used to remove disc material.  Disc shavers of increasing size was then used to clean the disc space as  well as to restore disc space height.  The interbody space was prepared with rasps and curettes with removal of the cartilage from the endplates.  Appropriate sized interbody spacer was then sized.  The inner body space was packed with morselized allograft mixed with autograft and BMP.  A size 10 mm banana shaped interbody spacer was then tamped into place with a mallet under x-ray guidance.  It was then expanded until snug with the endplates.  AP and lateral x-ray confirmed good position of the implant.  The retractor was then removed and meticulous hemostasis was obtained in the muscle layer and subcutaneous layer.   Navigated initial dilator was then docked on the left L5-S1 facet.  Sequential dilators and final tubular retractor was placed and locked in position, with navigation confirming good placement.  The microscope was then introduced in the field to allow for intraoperative microdissection.  Remaining muscle was moved off of the degenerated facet joint.  The inferior facets was removed using an osteotome and harvested for autograft.  Overgrown superior facets was then removed with rongeurs.  The traversing nerve root was identified and good decompression was performed with removal of overgrown ligament and facets with rongeurs.  The exiting nerve root in the foramen was also seen.  Epidural veins in the foramen were coagulated and cut with allowed access to the disc space lateral to the traversing nerve root.  The traversing nerve root was gently retracted medially to allow greater access to the disc.  The disc was opened with 15 blade and pituitary rongeur was used to remove disc material.  Disc shavers of increasing size was then used to clean the disc space as well as to restore disc space height.  The interbody space was prepared with rasps and curettes with removal of the cartilage from the endplates.  Appropriate sized interbody spacer was then sized.  The inner body space was packed with  morselized allograft mixed with autograft and BMP.  A size 8 mm banana shaped interbody spacer was then tamped into place with a mallet under x-ray guidance.  It was then expanded until snug with the endplates.  AP and lateral x-ray confirmed good position of the implant.  The wound was then irrigated thoroughly.  The retractor was then removed and meticulous hemostasis was obtained in the muscle layer and subcutaneous layer.    Left L4, L5, and S1 pedicle screws were then placed using navigation.  Good purchase was noted.   Rods were then passed through the screw towers bilaterally and secured with screw caps.  AP and lateral X-ray confirmed good placement of instrumentation and interbody.  The screw caps were final tightened and the towers removed.  Navigated drill was used to decorticate the facet joints of L4-5 and L5-S1 on the right side, and bone graft was placed in the facet and lateral gutter.  Wounds were irrigated thoroughly .  Exparel  mixed with Marcaine  was injected into the paraspinous muscles and subcutaneous tissues bilaterally.  The fascia was closed with 0 Vicryl stitches.  The dermal layer was closed with 2-0 Vicryl stitches in buried interrupted fashion.  The skin incisions were closed with 4-0 Monocryl subcuticular manner followed by Dermabond.  The PSIS pin was removed and small incision closed with 3-0 vicryl in buried fashion and Dermabond.  Sterile dressings were placed.  Patient was then flipped supine and extubated by the anesthesia service following commands and all 4 extremities.  All counts were correct at the end of surgery.  No complications were noted.

## 2024-02-01 NOTE — Anesthesia Procedure Notes (Signed)
 Procedure Name: Intubation Date/Time: 02/01/2024 8:13 AM  Performed by: Harrold Macintosh, CRNAPre-anesthesia Checklist: Patient identified, Emergency Drugs available, Suction available and Patient being monitored Patient Re-evaluated:Patient Re-evaluated prior to induction Oxygen Delivery Method: Circle system utilized Preoxygenation: Pre-oxygenation with 100% oxygen Induction Type: IV induction Ventilation: Mask ventilation without difficulty Laryngoscope Size: Mac and 3 Grade View: Grade I Tube type: Oral Tube size: 7.0 mm Number of attempts: 1 Airway Equipment and Method: Stylet and Oral airway Placement Confirmation: ETT inserted through vocal cords under direct vision, positive ETCO2 and breath sounds checked- equal and bilateral Secured at: 22 cm Tube secured with: Tape Dental Injury: Teeth and Oropharynx as per pre-operative assessment

## 2024-02-02 ENCOUNTER — Other Ambulatory Visit (HOSPITAL_COMMUNITY): Payer: Self-pay

## 2024-02-02 DIAGNOSIS — M5416 Radiculopathy, lumbar region: Secondary | ICD-10-CM | POA: Diagnosis not present

## 2024-02-02 LAB — GLUCOSE, CAPILLARY: Glucose-Capillary: 128 mg/dL — ABNORMAL HIGH (ref 70–99)

## 2024-02-02 MED ORDER — DOCUSATE SODIUM 100 MG PO CAPS
100.0000 mg | ORAL_CAPSULE | Freq: Two times a day (BID) | ORAL | 0 refills | Status: AC | PRN
  Filled 2024-02-02: qty 30, 15d supply, fill #0

## 2024-02-02 MED ORDER — OXYCODONE HCL 10 MG PO TABS
10.0000 mg | ORAL_TABLET | ORAL | 0 refills | Status: AC | PRN
  Filled 2024-02-02: qty 30, 5d supply, fill #0

## 2024-02-02 MED FILL — Thrombin For Soln 5000 Unit: CUTANEOUS | Qty: 5000 | Status: AC

## 2024-02-02 NOTE — Progress Notes (Signed)
 Patient in no acute distress nor complaints of pain nor discomfort; incision on back is clean, dry and intact; No c/o pain at this time. Room was checked and accounted for all patient's belongings; discharge instructions concerning her medications, incision care, follow up appointment and when to call the doctor as needed were all discussed with patient and husband by RN and they both expressed understanding on the instructions given

## 2024-02-02 NOTE — Evaluation (Addendum)
 Occupational Therapy Evaluation and Discharge Patient Details Name: Haley Dunn MRN: [id number] DOB: Aug 25, 1965 Today's Date: 02/02/2024   History of Present Illness   Pt is a 58 y/o female presenting on 10/28 for same day Ltransforaminal lumbar interbody fusion L4-5, L5-S1. PMH includes: anxiety, cataracts, DM, HTN, IBS, OA, PVD, prior ACDF.     Clinical Impressions Patient admitted for above and presents with problem list below.  PTA pt was independent. Patient was educated on brace mgmt and wear schedule, back precautions, ADL compensatory techniques, AE/DME, mobility progression, safety and recommendations.  Today, pt demonstrated ability to complete transfers using RW with modified independence, functional mobility using RW with modified independence, and ADLs with modified independence (educated on tub transfers and use of 3:1 as shower chair, patient/spouse voiced understanding and pt will have supervision for shower at home).  At discharge, pt will have support from spouse 24/7.  Based on performance today, no further OT needs identified.  OT will sign off.       If plan is discharge home, recommend the following:   Assistance with cooking/housework;Assist for transportation;Help with stairs or ramp for entrance     Functional Status Assessment   Patient has had a recent decline in their functional status and demonstrates the ability to make significant improvements in function in a reasonable and predictable amount of time.     Equipment Recommendations   BSC/3in1     Recommendations for Other Services         Precautions/Restrictions   Precautions Precautions: Back;Fall Precaution Booklet Issued: Yes (comment) Recall of Precautions/Restrictions: Intact Precaution/Restrictions Comments: Pt demonstrated teach back of back precautions, intermittent cueing functionally to avoid twisting Required Braces or Orthoses: Spinal Brace Spinal Brace: Applied in  sitting position;Lumbar corset Restrictions Weight Bearing Restrictions Per Provider Order: No     Mobility Bed Mobility               General bed mobility comments: EOB upon entry and exit    Transfers Overall transfer level: Modified independent                        Balance Overall balance assessment: No apparent balance deficits (not formally assessed)                                         ADL either performed or assessed with clinical judgement   ADL Overall ADL's : Modified independent                                       General ADL Comments: pt able to manage brace, LB dressing with modified independence after education of techniques. reviewed compesnatory techniques for safety and back precautions.  spouse present and able to assist as needed.  Recommended use of 3:1 in tub for chair and educated on use.     Vision   Vision Assessment?: No apparent visual deficits     Perception         Praxis         Pertinent Vitals/Pain Pain Assessment Pain Assessment: Faces Faces Pain Scale: Hurts even more Pain Location: Back Pain Descriptors / Indicators: Discomfort, Operative site guarding Pain Intervention(s): Limited activity within patient's tolerance, Monitored during session, Repositioned     Extremity/Trunk Assessment  Upper Extremity Assessment Upper Extremity Assessment: Overall WFL for tasks assessed   Lower Extremity Assessment Lower Extremity Assessment: Defer to PT evaluation   Cervical / Trunk Assessment Cervical / Trunk Assessment: Back Surgery   Communication Communication Communication: No apparent difficulties   Cognition Arousal: Alert Behavior During Therapy: WFL for tasks assessed/performed Cognition: No apparent impairments                               Following commands: Intact       Cueing  General Comments   Cueing Techniques: Verbal cues     Exercises      Shoulder Instructions      Home Living Family/patient expects to be discharged to:: Private residence Living Arrangements: Spouse/significant other Available Help at Discharge: Family;Available 24 hours/day Type of Home: Mobile home       Home Layout: One level     Bathroom Shower/Tub: Walk-in shower;Tub/shower unit   Bathroom Toilet: Standard     Home Equipment: Agricultural Consultant (2 wheels);Cane - quad;Cane - single point         Prior Functioning/Environment Prior Level of Function : Independent/Modified Independent                    OT Problem List: Pain;Decreased knowledge of precautions;Decreased knowledge of use of DME or AE;Decreased activity tolerance   OT Treatment/Interventions:        OT Goals(Current goals can be found in the care plan section)   Acute Rehab OT Goals Patient Stated Goal: home OT Goal Formulation: With patient   OT Frequency:       Co-evaluation              AM-PAC OT 6 Clicks Daily Activity     Outcome Measure Help from another person eating meals?: None Help from another person taking care of personal grooming?: None Help from another person toileting, which includes using toliet, bedpan, or urinal?: None Help from another person bathing (including washing, rinsing, drying)?: None Help from another person to put on and taking off regular upper body clothing?: None Help from another person to put on and taking off regular lower body clothing?: None 6 Click Score: 24   End of Session Equipment Utilized During Treatment: Rolling walker (2 wheels);Back brace Nurse Communication: Mobility status;Precautions  Activity Tolerance: Patient tolerated treatment well Patient left: with call bell/phone within reach;with family/visitor present;Other (comment) (sitting EOB)  OT Visit Diagnosis: Other abnormalities of gait and mobility (R26.89);Pain Pain - part of body:  (back)                Time: 9157-9099 OT Time  Calculation (min): 18 min Charges:  OT General Charges $OT Visit: 1 Visit OT Evaluation $OT Eval Low Complexity: 1 Low  Etta NOVAK, OT Acute Rehabilitation Services Office [phone] Secure Chat Preferred    Etta GORMAN Hope 02/02/2024, 9:33 AM

## 2024-02-02 NOTE — Plan of Care (Signed)

## 2024-02-02 NOTE — Discharge Instructions (Addendum)
 Wound Care Keep incision covered and dry until post op day 3. You may remove the Honeycomb dressing on post op day 3. Leave the glue on back.  They will fall off by themselves. Do not put any creams, lotions, or ointments on incision. You are fine to shower. Let water run over incision and pat dry.   Activity Walk each and every day, increasing distance each day. No lifting greater than 8 lbs.  No lifting no bending no twisting no driving . 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 If provided with back brace, wear when out of bed.  It is not necessary to wear brace in bed.  Diet Resume your normal diet.   Return to Work Will be discussed at your follow up appointment.  Call Your Doctor If Any of These Occur Redness, drainage, or swelling at the wound.  Temperature greater than 101 degrees. Severe pain not relieved by pain medication. Incision starts to come apart.  Follow Up Appt Call [phone] if you have one or any problem.

## 2024-02-02 NOTE — Evaluation (Signed)
 Physical Therapy Brief Evaluation and Discharge Note Patient Details Name: Haley Dunn MRN: [id number] DOB: February 22, 1966 Today's Date: 02/02/2024   History of Present Illness  Pt is a 58 y/o female presenting on 10/28 for same day Ltransforaminal lumbar interbody fusion L4-5, L5-S1. PMH includes: anxiety, cataracts, DM, HTN, IBS, OA, PVD, prior ACDF.  Clinical Impression  Pt pleasant and agreeable throughout PT session. Both pt and husband demonstrated initiative and preparation for this surgery, including understanding and demonstrating log roll and discussing guarding techniques for gait and stairs.\ Pt able to apply brace with cues for how to further tighten the brace. Ambulated 450' today with RW and CGA, requiring 2 standing rest breaks as she c/o of back pain, hip pain, and L foot pain. Able to perform step to pattern on 4 stairs with CGA. Husband educated and practiced guarding in preparation for navigating home environment. Demonstrated good teach back of back precautions. Recommending no further PT follow up upon d/c.        PT Assessment Patient does not need any further PT services  Assistance Needed at Discharge  PRN    Equipment Recommendations None recommended by PT  Recommendations for Other Services       Precautions/Restrictions Precautions Precautions: Back;Fall Precaution Booklet Issued: Yes (comment) Recall of Precautions/Restrictions: Intact Precaution/Restrictions Comments: Pt demonstrated teach back of back precautions. Required Braces or Orthoses: Spinal Brace Spinal Brace: Applied in sitting position;Lumbar corset Restrictions Weight Bearing Restrictions Per Provider Order: No        Mobility  Bed Mobility Rolling: Modified independent (Device/Increase time) Supine/Sidelying to sit: Modified independent (Device/Increased time)   General bed mobility comments: Pt demonstrated proper log rolling technique without needing to be cued to do  so.  Transfers Overall transfer level: Modified independent Equipment used: None               General transfer comment: Pt required slightly inc time to perform STS, but did not need AD or physical assist to perform. Pt c/o hx of vertigo, and stated slight dizziness upon standing that cleared quickly.    Ambulation/Gait Ambulation/Gait assistance: Contact guard assist Gait Distance (Feet): 450 Feet Assistive device: Rolling walker (2 wheels) Gait Pattern/deviations: Step-through pattern, Decreased step length - right, Decreased step length - left Gait Speed: Below normal General Gait Details: Pt demonstrated proper technique with RW, including staying within the bars. Step through technique used throughout. Required 2 standing rest breaks d/t low back, L foot, and hip pain.  Home Activity Instructions Home Activity Instructions: Discussed brace orders, to perform light activity including walking within tolerance, and to avoid vigorous activity within first several weeks. Pt should avoid prolonged positioning to avoid increasing stiffness and pain in low back.  Stairs Stairs: Yes Stairs assistance: Contact guard assist Stair Management: One rail Left, Step to pattern, Forwards Number of Stairs: 4 General stair comments: Practiced ascending and descending stairs with one rail on L first with therapist, then with spouse to simulate home environment. Pt demonstrated no LOB throughout, step to pattern for ascending and descending. Spouse demonstrated proper guarding techniques as instructed by therapist.  Modified Rankin (Stroke Patients Only)        Balance                          Pertinent Vitals/Pain   Pain Assessment Pain Assessment: 0-10 Pain Score: 8  Pain Location: Back (Pt also c/o hip and L foot pain that has been  occurring since before her surgery.) Pain Descriptors / Indicators: Discomfort Pain Intervention(s): Limited activity within patient's  tolerance, Monitored during session     Home Living Family/patient expects to be discharged to:: Private residence Living Arrangements: Spouse/significant other Available Help at Discharge: Family;Available 24 hours/day Home Environment: Stairs to enter;Rail - right;Rail - left  Stairs-Number of Steps: 5 Home Equipment: Rolling Walker (2 wheels);Cane - quad;Cane - single point   Additional Comments: Pt's husband available to help 24/7 as needed. Both demonstrated intiative in preparing for surgery including demonstrating how to log roll and discussing guarding techniques.    Prior Function Level of Independence: Independent Comments: Did not use AD at baseline    UE/LE Assessment   UE ROM/Strength/Tone/Coordination: WFL    LE ROM/Strength/Tone/Coordination: [facility]      Communication   Communication Communication: No apparent difficulties     Cognition Overall Cognitive Status: Appears within functional limits for tasks assessed/performed       General Comments General comments (skin integrity, edema, etc.): Pt and spouse appear amply prepared for surgery and discussed log roll techniques, toileting techniques with RW, and guarding.    Exercises     Assessment/Plan    PT Problem List         PT Visit Diagnosis Difficulty in walking, not elsewhere classified (R26.2)    No Skilled PT All education completed;Patient is supervision for all activity/mobility;Patient is modified independent with all activity/mobility (Pt is superivision-ModI for all activity mobility and will have attentive husband available 24/7.)   Co-evaluation                AMPAC 6 Clicks Help needed turning from your back to your side while in a flat bed without using bedrails?: None Help needed moving from lying on your back to sitting on the side of a flat bed without using bedrails?: None Help needed moving to and from a bed to a chair (including a wheelchair)?: None Help needed standing  up from a chair using your arms (e.g., wheelchair or bedside chair)?: None Help needed to walk in hospital room?: A Little Help needed climbing 3-5 steps with a railing? : A Little 6 Click Score: 22      End of Session Equipment Utilized During Treatment: Gait belt;Back brace Activity Tolerance: Patient tolerated treatment well Patient left: in bed;with family/visitor present (Seated EOB with OT in room.) Nurse Communication: Mobility status PT Visit Diagnosis: Difficulty in walking, not elsewhere classified (R26.2)     Time: 9181-9156 PT Time Calculation (min) (ACUTE ONLY): 25 min  Charges:          Ellon Marasco, SPT   Louis Gaw  02/02/2024, 9:04 AM

## 2024-02-02 NOTE — Discharge Summary (Signed)
 Patient ID: AUNDRAYA DRIPPS MRN: [id number] DOB/AGE: 1966/01/25 58 y.o.  Admit date: 02/01/2024 Discharge date: 02/02/2024  Admission Diagnoses: Radiculopathy, lumbar region [M54.16] Lumbar radiculopathy [M54.16]   Discharge Diagnoses: Same   Discharged Condition: Stable  Hospital Course:  Haley Dunn is a 58 y.o. female who was admitted following an uncomplicated L4-S1 MIS TLIF. They were recovered in PACU and transferred to the floor. Hospital course was uncomplicated. Pt stable for discharge today. Pt to f/u in office for routine post op visit. Pt is in agreement w/ plan.    Discharge Exam: Blood pressure (!) 97/57, pulse 100, temperature 98.4 F (36.9 C), temperature source Oral, resp. rate 17, height 5' 4 (1.626 m), weight 83 kg, SpO2 95%. A&O Speech fluent, appropriate Strength/sensation grossly intact BUE/BLE.  SILTx4.    Disposition: Discharge disposition: 01-Home or Self Care       Discharge Instructions     If the dressing is still on your incision site when you go home, remove it on the third day after your surgery date. Remove dressing if it begins to fall off, or if it is dirty or damaged before the third day.   Complete by: As directed    Incentive spirometry RT   Complete by: As directed       Allergies as of 02/02/2024       Reactions   Codeine Itching   Latex Rash   Sulfa Antibiotics Rash        Medication List     PAUSE taking these medications    aspirin  EC 81 MG tablet Wait to take this until: February 08, 2024 Take 81 mg by mouth daily.   clopidogrel  75 MG tablet Wait to take this until: February 08, 2024 Commonly known as: PLAVIX  Take 1 tablet (75 mg total) by mouth daily.   meloxicam 15 MG tablet Wait to take this until: February 08, 2024 Commonly known as: MOBIC Take 15 mg by mouth daily as needed for pain.       TAKE these medications    acetaminophen  500 MG tablet Commonly known as: TYLENOL  Take 500 mg by mouth  every 6 (six) hours as needed for headache.   acyclovir  400 MG tablet Commonly known as: ZOVIRAX  Take 400 mg by mouth 2 (two) times daily as needed (reaction).   albuterol  108 (90 Base) MCG/ACT inhaler Commonly known as: VENTOLIN  HFA Inhale 2 puffs into the lungs every 6 (six) hours as needed for wheezing or shortness of breath.   ALPRAZolam  1 MG tablet Commonly known as: XANAX  Take 0.5 tablets by mouth 2 (two) times daily as needed for anxiety.   baclofen  10 MG tablet Commonly known as: LIORESAL  Take 10 mg by mouth 3 (three) times daily as needed for muscle spasms.   citalopram  20 MG tablet Commonly known as: CELEXA  Take 20 mg by mouth daily.   docusate sodium  100 MG capsule Commonly known as: COLACE Take 1 capsule (100 mg total) by mouth 2 (two) times daily as needed for mild constipation.   furosemide  40 MG tablet Commonly known as: LASIX  Take 40 mg by mouth daily as needed for fluid or edema.   gabapentin 100 MG capsule Commonly known as: NEURONTIN Take 300 mg by mouth at bedtime. Takes one tab (100mg ) every mornining & 3 tabs (300mg ) every evening   glipiZIDE  5 MG 24 hr tablet Commonly known as: GLUCOTROL  XL Take 5 mg by mouth daily with breakfast.   Januvia 100 MG tablet Generic drug:  sitaGLIPtin Take 100 mg by mouth daily.   metFORMIN  1000 MG tablet Commonly known as: GLUCOPHAGE  Take 1,000 mg by mouth 2 (two) times daily.   Oxycodone  HCl 10 MG Tabs Take 1 tablet (10 mg total) by mouth every 4 (four) hours as needed for severe pain (pain score 7-10).   pantoprazole  40 MG tablet Commonly known as: PROTONIX  Take 40 mg by mouth daily as needed (acid reflux).   pravastatin  40 MG tablet Commonly known as: PRAVACHOL  Take 40 mg by mouth daily.   valsartan 160 MG tablet Commonly known as: DIOVAN Take 160 mg by mouth daily.               Discharge Care Instructions  (From admission, onward)           Start     Ordered   02/02/24 0000  If the  dressing is still on your incision site when you go home, remove it on the third day after your surgery date. Remove dressing if it begins to fall off, or if it is dirty or damaged before the third day.        02/02/24 0845             Signed: CAMIE CAYLIN Haley Dunn 02/02/2024, 8:46 AM

## 2024-02-07 ENCOUNTER — Encounter: Payer: Self-pay | Admitting: Radiology

## 2024-03-07 ENCOUNTER — Other Ambulatory Visit: Payer: Self-pay | Admitting: Physician Assistant

## 2024-03-07 NOTE — Telephone Encounter (Signed)
Patient needs to make an appointment before next refill.

## 2024-04-14 ENCOUNTER — Other Ambulatory Visit (HOSPITAL_COMMUNITY): Payer: Self-pay | Admitting: Surgery

## 2024-04-14 DIAGNOSIS — M4712 Other spondylosis with myelopathy, cervical region: Secondary | ICD-10-CM

## 2024-04-14 DIAGNOSIS — M25551 Pain in right hip: Secondary | ICD-10-CM

## 2024-04-24 ENCOUNTER — Ambulatory Visit (HOSPITAL_COMMUNITY)

## 2024-04-24 ENCOUNTER — Ambulatory Visit (HOSPITAL_COMMUNITY): Admission: RE | Admit: 2024-04-24 | Source: Ambulatory Visit

## 2024-05-01 ENCOUNTER — Ambulatory Visit (HOSPITAL_COMMUNITY)

## 2024-05-10 ENCOUNTER — Ambulatory Visit (HOSPITAL_COMMUNITY)

## 2024-05-18 ENCOUNTER — Ambulatory Visit (HOSPITAL_COMMUNITY)
